# Patient Record
Sex: Male | Born: 1985
Health system: Southern US, Community
[De-identification: ages and names within clinical notes are randomized; demographics above are authoritative.]

## PROBLEM LIST (undated history)

## (undated) DIAGNOSIS — T7840XA Allergy, unspecified, initial encounter: Secondary | ICD-10-CM

## (undated) DIAGNOSIS — J45909 Unspecified asthma, uncomplicated: Secondary | ICD-10-CM

## (undated) HISTORY — DX: Allergy, unspecified, initial encounter: T78.40XA

## (undated) HISTORY — DX: Unspecified asthma, uncomplicated: J45.909

---

## 2014-10-18 ENCOUNTER — Emergency Department: Payer: Self-pay | Admitting: Student

## 2014-10-20 ENCOUNTER — Ambulatory Visit (INDEPENDENT_AMBULATORY_CARE_PROVIDER_SITE_OTHER): Payer: BLUE CROSS/BLUE SHIELD | Admitting: Internal Medicine

## 2014-10-20 ENCOUNTER — Encounter: Payer: Self-pay | Admitting: Internal Medicine

## 2014-10-20 VITALS — BP 146/84 | HR 104 | Temp 98.7°F | Wt 318.0 lb

## 2014-10-20 DIAGNOSIS — E669 Obesity, unspecified: Secondary | ICD-10-CM | POA: Insufficient documentation

## 2014-10-20 DIAGNOSIS — K219 Gastro-esophageal reflux disease without esophagitis: Secondary | ICD-10-CM

## 2014-10-20 DIAGNOSIS — I10 Essential (primary) hypertension: Secondary | ICD-10-CM

## 2014-10-20 NOTE — Progress Notes (Signed)
HPI  Pt presents to the clinic today to establish care and for management of the conditions listed below.  HTN: He reports he was started on HCTZ (January 2016) for his blood pressure. He took it for 2 days then developed a headache, so he stopped the medication. His BP today is 146/84. He denies headache, blurred vision, chest pain or shortness of breath.  Obesity: His weight today is 318 lbs. He reports he has lost 30 lbs in the last 2 months. He has been working on diet and exercise.  He also reports he has been having some fatigue, loss of appetite, abdominal pain, bloating, belching, loose watery stool. He reports this has been consistent for the last 2 weeks. He has had some associated nausea and vomiting. He has been able to correlate his symptoms with drinking something acidic. He went to UC 2 days ago for the same. Blood work was normal. Ultrasound of liver and gallbladder were normal. He was given zofran and omeprazole. He reports significant improvement in his symptoms although the zofran has now made him constipated. He wants to know where he goes from here.  Flu: > 5 years ago Tetanus: > 10 years ago Dentist: as needed  Past Medical History  Diagnosis Date  . Allergy   . Asthma     childhood    Current Outpatient Prescriptions  Medication Sig Dispense Refill  . hydrochlorothiazide (HYDRODIURIL) 25 MG tablet Take 25 mg by mouth daily.    [name] Kitchen. omeprazole (PRILOSEC) 20 MG capsule Take 20 mg by mouth daily.   0  . ondansetron (ZOFRAN-ODT) 4 MG disintegrating tablet Take 4 mg by mouth every 8 (eight) hours as needed.   0   No current facility-administered medications for this visit.    No Known Allergies  Family History  Problem Relation Age of Onset  . Stroke Maternal Grandfather   . Hypertension Paternal Grandfather     History   Social History  . Marital Status: Single    Spouse Name: N/A  . Number of Children: N/A  . Years of Education: N/A   Occupational  History  . Not on file.   Social History Main Topics  . Smoking status: Never Smoker   . Smokeless tobacco: Never Used  . Alcohol Use: No  . Drug Use: No  . Sexual Activity: Not on file   Other Topics Concern  . Not on file   Social History Narrative  . No narrative on file    ROS:  Constitutional: Pt reports fatigue. Denies fever, malaise, headache or abrupt weight changes.  HEENT: Denies eye pain, eye redness, ear pain, ringing in the ears, wax buildup, runny nose, nasal congestion, bloody nose, or sore throat. Respiratory: Denies difficulty breathing, shortness of breath, cough or sputum production.   Cardiovascular: Denies chest pain, chest tightness, palpitations or swelling in the hands or feet.  Gastrointestinal: Pt reports bloating. Denies abdominal pain,constipation, diarrhea or blood in the stool.  Skin: Denies redness, rashes, lesions or ulcercations.  Neurological: Denies dizziness, difficulty with memory, difficulty with speech or problems with balance and coordination.   No other specific complaints in a complete review of systems (except as listed in HPI above).  PE:  BP 146/84 mmHg  Pulse 104  Temp(Src) 98.7 F (37.1 C) (Oral)  Wt 318 lb (144.244 kg)  SpO2 98% Wt Readings from Last 3 Encounters:  10/20/14 318 lb (144.244 kg)    General: Appears his stated age, obese in NAD. HEENT: Head:  normal shape and size; Eyes: sclera white, no icterus, conjunctiva pink, PERRLA and EOMs intact; Neck:  Neck supple, trachea midline. No masses, lumps or thyromegaly present.  Cardiovascular: Normal rate and rhythm. S1,S2 noted.  No murmur, rubs or gallops noted. No JVD or BLE edema. No carotid bruits noted. Pulmonary/Chest: Normal effort and positive vesicular breath sounds. No respiratory distress. No wheezes, rales or ronchi noted.  Abdomen: Soft and nontender. Normal bowel sounds, no bruits noted. No distention or masses noted. Liver, spleen and kidneys non  palpable. Neurological: Alert and oriented.  Psychiatric: Mood and affect normal. Behavior is normal. Judgment and thought content normal.    Assessment and Plan:  RTC in 1 month for BP follow up

## 2014-10-20 NOTE — Assessment & Plan Note (Signed)
New onset but has been severe over the last 2 weeks [facility] He reports labs and ultrasound were normal He is improving on Prilosec Continue for now Since acidic foods make it worse, try to avoid acidic foods

## 2014-10-20 NOTE — Assessment & Plan Note (Signed)
We will restart HCTZ He will take 1/2 tab daily x 1 week, then increase to 2 tabs If he experiences headaches again, he will stop medication and call me [facility] will [facility]

## 2014-10-20 NOTE — Assessment & Plan Note (Signed)
He is working on diet and exercise  Continue for now

## 2014-10-20 NOTE — Patient Instructions (Signed)

## 2014-10-20 NOTE — Addendum Note (Signed)
Addended by: Lorre MunroeBAITY, Mazal Ebey W on: 10/20/2014 03:58 PM   Modules accepted: Level of Service, SmartSet

## 2014-10-21 ENCOUNTER — Telehealth: Payer: Self-pay | Admitting: Internal Medicine

## 2014-10-21 ENCOUNTER — Encounter: Payer: Self-pay | Admitting: Internal Medicine

## 2014-10-21 NOTE — Telephone Encounter (Signed)
emmi emailed °

## 2014-10-27 ENCOUNTER — Encounter: Payer: Self-pay | Admitting: Internal Medicine

## 2014-11-11 ENCOUNTER — Encounter: Payer: Self-pay | Admitting: Internal Medicine

## 2014-11-11 ENCOUNTER — Ambulatory Visit (INDEPENDENT_AMBULATORY_CARE_PROVIDER_SITE_OTHER): Payer: BLUE CROSS/BLUE SHIELD | Admitting: Internal Medicine

## 2014-11-11 VITALS — BP 138/86 | HR 93 | Temp 98.1°F | Wt 310.0 lb

## 2014-11-11 DIAGNOSIS — K219 Gastro-esophageal reflux disease without esophagitis: Secondary | ICD-10-CM | POA: Diagnosis not present

## 2014-11-11 DIAGNOSIS — I10 Essential (primary) hypertension: Secondary | ICD-10-CM

## 2014-11-11 LAB — BASIC METABOLIC PANEL
BUN: 8 mg/dL (ref 6–23)
CHLORIDE: 102 meq/L (ref 96–112)
CO2: 28 mEq/L (ref 19–32)
Calcium: 9.4 mg/dL (ref 8.4–10.5)
Creatinine, Ser: 0.9 mg/dL (ref 0.40–1.50)
GFR: 127.67 mL/min (ref >60.00)
Glucose, Bld: 87 mg/dL (ref 70–99)
Potassium: 3.6 mEq/L (ref 3.5–5.1)
SODIUM: 136 meq/L (ref 135–145)

## 2014-11-11 MED ORDER — HYDROCHLOROTHIAZIDE 25 MG PO TABS: 25.0000 mg | ORAL_TABLET | Freq: Every day | ORAL | Status: DC

## 2014-11-11 NOTE — Progress Notes (Signed)
Subjective:    Patient ID: Chris Banks, male    DOB: 03/23/1986, 29 y.o.   MRN: [id number]  HPI  Pt presents to the clinic today for 1 month follow up HTN. He was restarted on his HCTZ at his last visit. He was on this prior but for a short times because he felt like it was causing him to have headaches. He was instructed to start the medication, and let me know if he was having any adverse effects. He has been taking the medication as directed.He denies any adverse effect. His BP today is 138/86.  He also reports the Omeprazole is helping him with his reflux symptoms. He continues to have decreased appetite in the morning but denies nausea, belching, bloating and loose stools. He is taking the Prilosec as directed. He is concerned about some changes in his stool color. It has been a orange color. He does not think it is blood in his stool but he wants to just to make sure.  Review of Systems      Past Medical History  Diagnosis Date  . Allergy   . Asthma     childhood    Current Outpatient Prescriptions  Medication Sig Dispense Refill  . hydrochlorothiazide (HYDRODIURIL) 25 MG tablet Take 25 mg by mouth daily.    [name] Kitchen. omeprazole (PRILOSEC) 20 MG capsule Take 20 mg by mouth daily.   0  . ondansetron (ZOFRAN-ODT) 4 MG disintegrating tablet Take 4 mg by mouth every 8 (eight) hours as needed.   0   No current facility-administered medications for this visit.    No Known Allergies  Family History  Problem Relation Age of Onset  . Stroke Maternal Grandfather   . Hypertension Paternal Grandfather   . Cancer Neg Hx     History   Social History  . Marital Status: Single    Spouse Name: N/A  . Number of Children: N/A  . Years of Education: N/A   Occupational History  . Not on file.   Social History Main Topics  . Smoking status: Never Smoker   . Smokeless tobacco: Never Used  . Alcohol Use: No  . Drug Use: No  . Sexual Activity: Yes   Other Topics Concern  . Not on file    Social History Narrative     Constitutional: Denies fever, malaise, fatigue, headache or abrupt weight changes.  Respiratory: Denies difficulty breathing, shortness of breath, cough or sputum production.   Cardiovascular: Denies chest pain, chest tightness, palpitations or swelling in the hands or feet.  Gastrointestinal: Denies abdominal pain, bloating, constipation, diarrhea or blood in the stool.   Neurological: Denies dizziness, difficulty with memory, difficulty with speech or problems with balance and coordination.   No other specific complaints in a complete review of systems (except as listed in HPI above).  Objective:   Physical Exam   BP 138/86 mmHg  Pulse 93  Temp(Src) 98.1 F (36.7 C) (Oral)  Wt 310 lb (140.615 kg)  SpO2 98% Wt Readings from Last 3 Encounters:  11/11/14 310 lb (140.615 kg)  10/20/14 318 lb (144.244 kg)    General: Appears his stated age, obese in NAD. Skin: Warm, dry and intact. No rashes, lesions or ulcerations noted. HEENT: Head: normal shape and size; Eyes: sclera white, no icterus, conjunctiva pink, PERRLA and EOMs intact; Cardiovascular: Normal rate and rhythm. S1,S2 noted.  No murmur, rubs or gallops noted.  Pulmonary/Chest: Normal effort and positive vesicular breath sounds. No respiratory distress. No  wheezes, rales or ronchi noted.  Abdomen: Soft and nontender. Normal bowel sounds, no bruits noted. No distention or masses noted. Liver, spleen and kidneys non palpable. Neurological: Alert and oriented.  Rectal: No external hemorrhoid noted. Normal rectal tone, no internal mass noted.     Assessment & Plan:

## 2014-11-11 NOTE — Assessment & Plan Note (Signed)
Well controlled on HCTZ Will check BMET today Continue to work on diet and exercise

## 2014-11-11 NOTE — Patient Instructions (Signed)

## 2014-11-11 NOTE — Progress Notes (Signed)
Pre visit review using our clinic review tool, if applicable. No additional management support is needed unless otherwise documented below in the visit note. 

## 2014-11-11 NOTE — Assessment & Plan Note (Signed)
Symptoms improved on Prilosec He is concerned about blood in his stool, hemoccult negative Will check for Hpylori today

## 2014-11-11 NOTE — Addendum Note (Signed)
Addended by: Baldomero LamyHAVERS, Marigold Mom C on: 11/11/2014 04:23 PM   Modules accepted: Kipp BroodSmartSet

## 2014-11-12 ENCOUNTER — Encounter: Payer: Self-pay | Admitting: Internal Medicine

## 2014-11-13 ENCOUNTER — Encounter: Payer: Self-pay | Admitting: Internal Medicine

## 2014-11-13 LAB — H. PYLORI ANTIBODY, IGG: H Pylori IgG: NEGATIVE

## 2014-11-18 ENCOUNTER — Ambulatory Visit: Payer: BLUE CROSS/BLUE SHIELD | Admitting: Internal Medicine

## 2014-11-30 ENCOUNTER — Encounter: Payer: Self-pay | Admitting: Internal Medicine

## 2014-12-01 ENCOUNTER — Other Ambulatory Visit: Payer: Self-pay | Admitting: Internal Medicine

## 2014-12-01 ENCOUNTER — Encounter: Payer: Self-pay | Admitting: Internal Medicine

## 2014-12-01 MED ORDER — OMEPRAZOLE 20 MG PO CPDR: 20.0000 mg | DELAYED_RELEASE_CAPSULE | Freq: Every day | ORAL | Status: DC

## 2014-12-05 ENCOUNTER — Encounter: Payer: Self-pay | Admitting: Internal Medicine

## 2014-12-16 ENCOUNTER — Encounter: Payer: Self-pay | Admitting: Internal Medicine

## 2014-12-17 ENCOUNTER — Other Ambulatory Visit: Payer: Self-pay | Admitting: Internal Medicine

## 2014-12-17 MED ORDER — OMEPRAZOLE 20 MG PO CPDR: 20.0000 mg | DELAYED_RELEASE_CAPSULE | Freq: Every day | ORAL | Status: DC

## 2015-01-15 ENCOUNTER — Encounter: Payer: Self-pay | Admitting: Internal Medicine

## 2015-01-15 MED ORDER — OMEPRAZOLE 20 MG PO CPDR: 20.0000 mg | DELAYED_RELEASE_CAPSULE | Freq: Every day | ORAL | Status: DC

## 2015-02-09 ENCOUNTER — Encounter: Payer: Self-pay | Admitting: Internal Medicine

## 2015-02-09 DIAGNOSIS — I10 Essential (primary) hypertension: Secondary | ICD-10-CM

## 2015-02-10 MED ORDER — HYDROCHLOROTHIAZIDE 25 MG PO TABS
25.0000 mg | ORAL_TABLET | Freq: Every day | ORAL | Status: DC
Start: 2015-02-10 — End: 2015-04-15

## 2015-02-25 ENCOUNTER — Encounter: Payer: Self-pay | Admitting: Internal Medicine

## 2015-02-25 NOTE — Telephone Encounter (Signed)
I did ask pt if he was having any other Sx and what he has tried to cut down on the back and forth msg---review email response and please advise

## 2015-04-14 ENCOUNTER — Encounter: Payer: Self-pay | Admitting: Internal Medicine

## 2015-04-14 DIAGNOSIS — I10 Essential (primary) hypertension: Secondary | ICD-10-CM

## 2015-04-15 MED ORDER — HYDROCHLOROTHIAZIDE 25 MG PO TABS: 25.0000 mg | ORAL_TABLET | Freq: Every day | ORAL | Status: DC

## 2015-04-15 MED ORDER — OMEPRAZOLE 20 MG PO CPDR: 20.0000 mg | DELAYED_RELEASE_CAPSULE | Freq: Every day | ORAL | Status: DC

## 2015-05-13 ENCOUNTER — Ambulatory Visit (INDEPENDENT_AMBULATORY_CARE_PROVIDER_SITE_OTHER): Payer: BLUE CROSS/BLUE SHIELD | Admitting: Internal Medicine

## 2015-05-13 ENCOUNTER — Encounter: Payer: Self-pay | Admitting: Internal Medicine

## 2015-05-13 VITALS — BP 134/80 | HR 100 | Temp 98.7°F | Wt 290.0 lb

## 2015-05-13 DIAGNOSIS — E669 Obesity, unspecified: Secondary | ICD-10-CM | POA: Diagnosis not present

## 2015-05-13 DIAGNOSIS — Z23 Encounter for immunization: Secondary | ICD-10-CM | POA: Diagnosis not present

## 2015-05-13 DIAGNOSIS — K219 Gastro-esophageal reflux disease without esophagitis: Secondary | ICD-10-CM | POA: Diagnosis not present

## 2015-05-13 DIAGNOSIS — I10 Essential (primary) hypertension: Secondary | ICD-10-CM | POA: Diagnosis not present

## 2015-05-13 LAB — COMPREHENSIVE METABOLIC PANEL
ALT: 21 U/L (ref 0–53)
AST: 20 U/L (ref 0–37)
Albumin: 4.2 g/dL (ref 3.5–5.2)
Alkaline Phosphatase: 49 U/L (ref 39–117)
BUN: 10 mg/dL (ref 6–23)
CHLORIDE: 102 meq/L (ref 96–112)
CO2: 27 mEq/L (ref 19–32)
Calcium: 9.5 mg/dL (ref 8.4–10.5)
Creatinine, Ser: 0.89 mg/dL (ref 0.40–1.50)
GFR: 128.89 mL/min (ref >60.00)
GLUCOSE: 92 mg/dL (ref 70–99)
POTASSIUM: 3.7 meq/L (ref 3.5–5.1)
SODIUM: 137 meq/L (ref 135–145)
Total Bilirubin: 0.5 mg/dL (ref 0.2–1.2)
Total Protein: 7.5 g/dL (ref 6.0–8.3)

## 2015-05-13 MED ORDER — OMEPRAZOLE 20 MG PO CPDR: 20.0000 mg | DELAYED_RELEASE_CAPSULE | Freq: Every day | ORAL | Status: DC

## 2015-05-13 MED ORDER — HYDROCHLOROTHIAZIDE 25 MG PO TABS: 25.0000 mg | ORAL_TABLET | Freq: Every day | ORAL | Status: DC

## 2015-05-13 NOTE — Addendum Note (Signed)
Addended by: Roena Malady on: 05/13/2015 02:16 PM   Modules accepted: Orders

## 2015-05-13 NOTE — Patient Instructions (Signed)
Hypertension Hypertension, commonly called high blood pressure, is when the force of blood pumping through your arteries is too strong. Your arteries are the blood vessels that carry blood from your heart throughout your body. A blood pressure reading consists of a higher number over a lower number, such as 110/72. The higher number (systolic) is the pressure inside your arteries when your heart pumps. The lower number (diastolic) is the pressure inside your arteries when your heart relaxes. Ideally you want your blood pressure below 120/80. Hypertension forces your heart to work harder to pump blood. Your arteries may become narrow or stiff. Having untreated or uncontrolled hypertension can cause heart attack, stroke, kidney disease, and other problems. RISK FACTORS Some risk factors for high blood pressure are controllable. Others are not.  Risk factors you cannot control include:   Race. You may be at higher risk if you are African American.  Age. Risk increases with age.  Gender. Men are at higher risk than women before age 45 years. After age 65, women are at higher risk than men. Risk factors you can control include:  Not getting enough exercise or physical activity.  Being overweight.  Getting too much fat, sugar, calories, or salt in your diet.  Drinking too much alcohol. SIGNS AND SYMPTOMS Hypertension does not usually cause signs or symptoms. Extremely high blood pressure (hypertensive crisis) may cause headache, anxiety, shortness of breath, and nosebleed. DIAGNOSIS To check if you have hypertension, your health care provider will measure your blood pressure while you are seated, with your arm held at the level of your heart. It should be measured at least twice using the same arm. Certain conditions can cause a difference in blood pressure between your right and left arms. A blood pressure reading that is higher than normal on one occasion does not mean that you need treatment. If  it is not clear whether you have high blood pressure, you may be asked to return on a different day to have your blood pressure checked again. Or, you may be asked to monitor your blood pressure at home for 1 or more weeks. TREATMENT Treating high blood pressure includes making lifestyle changes and possibly taking medicine. Living a healthy lifestyle can help lower high blood pressure. You may need to change some of your habits. Lifestyle changes may include:  Following the DASH diet. This diet is high in fruits, vegetables, and whole grains. It is low in salt, red meat, and added sugars.  Keep your sodium intake below 2,300 mg per day.  Getting at least 30-45 minutes of aerobic exercise at least 4 times per week.  Losing weight if necessary.  Not smoking.  Limiting alcoholic beverages.  Learning ways to reduce stress. Your health care provider may prescribe medicine if lifestyle changes are not enough to get your blood pressure under control, and if one of the following is true:  You are 18-59 years of age and your systolic blood pressure is above 140.  You are 60 years of age or older, and your systolic blood pressure is above 150.  Your diastolic blood pressure is above 90.  You have diabetes, and your systolic blood pressure is over 140 or your diastolic blood pressure is over 90.  You have kidney disease and your blood pressure is above 140/90.  You have heart disease and your blood pressure is above 140/90. Your personal target blood pressure may vary depending on your medical conditions, your age, and other factors. HOME CARE INSTRUCTIONS    Have your blood pressure rechecked as directed by your health care provider.   Take medicines only as directed by your health care provider. Follow the directions carefully. Blood pressure medicines must be taken as prescribed. The medicine does not work as well when you skip doses. Skipping doses also puts you at risk for  problems.  Do not smoke.   Monitor your blood pressure at home as directed by your health care provider. SEEK MEDICAL CARE IF:   You think you are having a reaction to medicines taken.  You have recurrent headaches or feel dizzy.  You have swelling in your ankles.  You have trouble with your vision. SEEK IMMEDIATE MEDICAL CARE IF:  You develop a severe headache or confusion.  You have unusual weakness, numbness, or feel faint.  You have severe chest or abdominal pain.  You vomit repeatedly.  You have trouble breathing. MAKE SURE YOU:   Understand these instructions.  Will watch your condition.  Will get help right away if you are not doing well or get worse.   This information is not intended to replace advice given to you by your health care provider. Make sure you discuss any questions you have with your health care provider.   Document Released: 07/25/2005 Document Revised: 12/09/2014 Document Reviewed: 05/17/2013 Elsevier Interactive Patient Education 2016 Elsevier Inc.  

## 2015-05-13 NOTE — Assessment & Plan Note (Addendum)
BP recheck 134/80 Will check CMET today Continue HCTZ, refilled today per request

## 2015-05-13 NOTE — Assessment & Plan Note (Signed)
No issues on Prilosec, refilled per request Discussed how weight loss will help improve his symtoms Will check CMET today

## 2015-05-13 NOTE — Progress Notes (Signed)
Pre visit review using our clinic review tool, if applicable. No additional management support is needed unless otherwise documented below in the visit note. 

## 2015-05-13 NOTE — Progress Notes (Signed)
Subjective:    Patient ID: Chris Banks, male    DOB: [birth year], 29 y.o.   MRN: [id number]  HPI  Pt presents to the clinic today for 6 month follow up of chronic conditions.  HTN: He takes the HCTZ daily as prescribed. His BP today is 142/78. ECG from 10/2014 reviewed.  GERD: No breakthrough symptoms on Omeprazole.  Obesity: He has lost 20 lbs since his last visit. He has started running. He has also cut back on his carb intake.  He does want his flu shot today.  Review of Systems      Past Medical History  Diagnosis Date  . Allergy   . Asthma     childhood    Current Outpatient Prescriptions  Medication Sig Dispense Refill  . hydrochlorothiazide (HYDRODIURIL) 25 MG tablet Take 1 tablet (25 mg total) by mouth daily. 30 tablet 2  . omeprazole (PRILOSEC) 20 MG capsule Take 1 capsule (20 mg total) by mouth daily. 30 capsule 5  . ondansetron (ZOFRAN-ODT) 4 MG disintegrating tablet Take 4 mg by mouth every 8 (eight) hours as needed.   0   No current facility-administered medications for this visit.    No Known Allergies  Family History  Problem Relation Age of Onset  . Stroke Maternal Grandfather   . Hypertension Paternal Grandfather   . Cancer Neg Hx     Social History   Social History  . Marital Status: Single    Spouse Name: N/A  . Number of Children: N/A  . Years of Education: N/A   Occupational History  . Not on file.   Social History Main Topics  . Smoking status: Never Smoker   . Smokeless tobacco: Never Used  . Alcohol Use: No  . Drug Use: No  . Sexual Activity: Yes   Other Topics Concern  . Not on file   Social History Narrative     Constitutional: Denies fever, malaise, fatigue, headache or abrupt weight changes.  Respiratory: Denies difficulty breathing, shortness of breath, cough or sputum production.   Cardiovascular: Denies chest pain, chest tightness, palpitations or swelling in the hands or feet.  Gastrointestinal: Denies abdominal  pain, bloating, constipation, diarrhea or blood in the stool.  Neurological: Denies dizziness, difficulty with memory, difficulty with speech or problems with balance and coordination.   No other specific complaints in a complete review of systems (except as listed in HPI above).  Objective:   Physical Exam  BP 142/78 mmHg  Pulse 100  Temp(Src) 98.7 F (37.1 C) (Oral)  Wt 290 lb (131.543 kg)  SpO2 98% Wt Readings from Last 3 Encounters:  05/13/15 290 lb (131.543 kg)  11/11/14 310 lb (140.615 kg)  10/20/14 318 lb (144.244 kg)    General: Appears his stated age, obese in NAD. Cardiovascular: Normal rate and rhythm. S1,S2 noted.  No murmur, rubs or gallops noted.  Pulmonary/Chest: Normal effort and positive vesicular breath sounds. No respiratory distress. No wheezes, rales or ronchi noted.  Abdomen: Soft and nontender. Normal bowel sounds, no bruits noted. No distention or masses noted. Liver, spleen and kidneys non palpable. Neurological: Alert and oriented.   BMET    Component Value Date/Time   NA 136 11/11/2014 1358   K 3.6 11/11/2014 1358   CL 102 11/11/2014 1358   CO2 28 11/11/2014 1358   GLUCOSE 87 11/11/2014 1358   BUN 8 11/11/2014 1358   CREATININE 0.90 11/11/2014 1358   CALCIUM 9.4 11/11/2014 1358  Assessment & Plan:

## 2015-05-13 NOTE — Assessment & Plan Note (Signed)
Congratulated him on the 28 lb weight loss Encouraged him to continue his diet and exercise regimen

## 2015-05-13 NOTE — Addendum Note (Signed)
Addended by: Lorre Munroe on: 05/13/2015 02:20 PM   Modules accepted: Kipp Brood

## 2015-08-10 ENCOUNTER — Encounter: Payer: Self-pay | Admitting: Internal Medicine

## 2015-09-07 ENCOUNTER — Encounter: Payer: Self-pay | Admitting: Internal Medicine

## 2015-09-14 ENCOUNTER — Encounter: Payer: Self-pay | Admitting: Internal Medicine

## 2015-09-14 ENCOUNTER — Ambulatory Visit (INDEPENDENT_AMBULATORY_CARE_PROVIDER_SITE_OTHER): Payer: BLUE CROSS/BLUE SHIELD | Admitting: Internal Medicine

## 2015-09-14 VITALS — BP 140/84 | HR 103 | Temp 98.1°F | Wt 294.0 lb

## 2015-09-14 DIAGNOSIS — K219 Gastro-esophageal reflux disease without esophagitis: Secondary | ICD-10-CM | POA: Diagnosis not present

## 2015-09-14 NOTE — Patient Instructions (Signed)
Probiotics  WHAT ARE PROBIOTICS?  Probiotics are the good bacteria and yeasts that live in your body and keep you and your digestive system healthy. Probiotics also help your body's defense (immune) system and protect your body against bad bacterial growth.   Certain foods contain probiotics, such as yogurt. Probiotics can also be purchased as a supplement. As with any supplement or drug, it is important to discuss its use with your health care provider.   WHAT AFFECTS THE BALANCE OF BACTERIA IN MY BODY?  The balance of bacteria in your body can be affected by:   · Antibiotic medicines. Antibiotics are sometimes necessary to treat infection. Unfortunately, they may kill good or friendly bacteria in your body as well as the bad bacteria. This may lead to stomach problems like diarrhea, gas, and cramping.  · Disease. Some conditions are the result of an overgrowth of bad bacteria, yeasts, parasites, or fungi. These conditions include:      Infectious diarrhea.    Stomach and respiratory infections.    Skin infections.    Irritable bowel syndrome (IBS).    Inflammatory bowel diseases.    Ulcer due to Helicobacter pylori (H. pylori) infection.    Tooth decay and periodontal disease.    Vaginal infections.  Stress and poor diet may also lower the good bacteria in your body.   WHAT TYPE OF PROBIOTIC IS RIGHT FOR ME?  Probiotics are available over the counter at your local pharmacy, health food, or grocery store. They come in many different forms, combinations of strains, and dosing strengths. Some may need to be refrigerated. Always read the label for storage and usage instructions.  Specific strains have been shown to be more effective for certain conditions. Ask your health care provider what option is best for you.   WHY WOULD I NEED PROBIOTICS?  There are many reasons your health care provider might recommend a probiotic supplement, including:   · Diarrhea.  · Constipation.  · IBS.  · Respiratory infections.  · Yeast  infections.  · Acne, eczema, and other skin conditions.  · Frequent urinary tract infections (UTIs).  ARE THERE SIDE EFFECTS OF PROBIOTICS?  Some people experience mild side effects when taking probiotics. Side effects are usually temporary and may include:   · Gas.  · Bloating.  · Cramping.  Rarely, serious side effects, such as infection or immune system changes, may occur.  WHAT ELSE DO I NEED TO KNOW ABOUT PROBIOTICS?   · There are many different strains of probiotics. Certain strains may be more effective depending on your condition. Probiotics are available in varying doses. Ask your health care provider which probiotic you should use and how often.    · If you are taking probiotics along with antibiotics, it is generally recommended to wait at least 2 hours between taking the antibiotic and taking the probiotic.    FOR MORE INFORMATION:   National Center for Complementary and Alternative Medicine http://nccam.nih.gov/     This information is not intended to replace advice given to you by your health care provider. Make sure you discuss any questions you have with your health care provider.     Document Released: 02/19/2014 Document Reviewed: 02/19/2014  Elsevier Interactive Patient Education ©2016 Elsevier Inc.

## 2015-09-14 NOTE — Progress Notes (Signed)
Pre visit review using our clinic review tool, if applicable. No additional management support is needed unless otherwise documented below in the visit note. 

## 2015-09-14 NOTE — Progress Notes (Signed)
Subjective:    Patient ID: Chris Banks, male    DOB: 1985/08/17, 30 y.o.   MRN: [id number]  HPI  Pt presents to the clinic today with c/o increased burping and burning sensation in the chest. The burning sensation is in the epigastric area, and radiates around to left pec. He notes it is worse after eating and at night. He was traveling this past week and the pain seemed to be worse as he was not eating his normal diet or schedule. He usually takes Omeprazole 20 mg daily. He has been taking Omeprazole BID in the morning and at night for about a week and has noticed some improvement. He reports his bowels are moving normally. He denies chest pain or shortness of breath.     Review of Systems  Past Medical History  Diagnosis Date  . Allergy   . Asthma     childhood    Current Outpatient Prescriptions  Medication Sig Dispense Refill  . hydrochlorothiazide (HYDRODIURIL) 25 MG tablet Take 1 tablet (25 mg total) by mouth daily. 90 tablet 1  . omeprazole (PRILOSEC) 20 MG capsule Take 1 capsule (20 mg total) by mouth daily. 90 capsule 1  . ondansetron (ZOFRAN-ODT) 4 MG disintegrating tablet Take 4 mg by mouth every 8 (eight) hours as needed. Reported on 09/14/2015  0   No current facility-administered medications for this visit.    No Known Allergies  Family History  Problem Relation Age of Onset  . Stroke Maternal Grandfather   . Hypertension Paternal Grandfather   . Cancer Neg Hx     Social History   Social History  . Marital Status: Single    Spouse Name: N/A  . Number of Children: N/A  . Years of Education: N/A   Occupational History  . Not on file.   Social History Main Topics  . Smoking status: Never Smoker   . Smokeless tobacco: Never Used  . Alcohol Use: 0.0 oz/week    0 Standard drinks or equivalent per week     Comment: rare  . Drug Use: No  . Sexual Activity: Yes   Other Topics Concern  . Not on file   Social History Narrative      Respiratory:  Denies difficulty breathing or shortness of breath. Cardiovascular: Denies palpitations or swelling in the hands or feet.  Gastrointestinal: Positive burning in epigastric area radiating to the left pec and increased burping. Denies constipation, diarrhea or blood in the stool.   No other specific complaints in a complete review of systems (except as listed in HPI above).     Objective:   Physical Exam  BP 140/84 mmHg  Pulse 103  Temp(Src) 98.1 F (36.7 C) (Oral)  Wt 294 lb (133.358 kg)  SpO2 98% Wt Readings from Last 3 Encounters:  09/14/15 294 lb (133.358 kg)  05/13/15 290 lb (131.543 kg)  11/11/14 310 lb (140.615 kg)    General: Appears his stated age, in NAD. Skin: Warm, dry and intact. No rashes, lesions or ulcerations noted. Cardiovascular: Normal rate and rhythm. S1,S2 noted.  No murmur, rubs or gallops noted.  Pulmonary/Chest: Normal effort and positive vesicular breath sounds. No respiratory distress. No wheezes, rales or ronchi noted.  Abdomen: Soft and nontender. Normal bowel sounds. No distention or masses noted. Liver, spleen and kidneys non palpable.    BMET    Component Value Date/Time   NA 137 05/13/2015 1417   K 3.7 05/13/2015 1417   CL 102 05/13/2015 1417  CO2 27 05/13/2015 1417   GLUCOSE 92 05/13/2015 1417   BUN 10 05/13/2015 1417   CREATININE 0.89 05/13/2015 1417   CALCIUM 9.5 05/13/2015 1417    Lipid Panel  No results found for: CHOL, TRIG, HDL, CHOLHDL, VLDL, LDLCALC  CBC No results found for: WBC, RBC, HGB, HCT, PLT, MCV, MCH, MCHC, RDW, LYMPHSABS, MONOABS, EOSABS, BASOSABS  Hgb A1C No results found for: HGBA1C       Assessment & Plan:  GERD:  Continue Prilosec BID until his appointment in April Get back on your normal diet regimen Consider starting on a Probiotic OTC If persist, consider referral to GI   RTC if symptoms worsen or as needed.

## 2015-10-08 ENCOUNTER — Ambulatory Visit (INDEPENDENT_AMBULATORY_CARE_PROVIDER_SITE_OTHER): Payer: BLUE CROSS/BLUE SHIELD | Admitting: Internal Medicine

## 2015-10-08 ENCOUNTER — Encounter: Payer: Self-pay | Admitting: Internal Medicine

## 2015-10-08 DIAGNOSIS — S0990XA Unspecified injury of head, initial encounter: Secondary | ICD-10-CM

## 2015-10-08 NOTE — Patient Instructions (Signed)
Concussion, Adult  A concussion, or closed-head injury, is a brain injury caused by a direct blow to the head or by a quick and sudden movement (jolt) of the head or neck. Concussions are usually not life-threatening. Even so, the effects of a concussion can be serious. If you have had a concussion before, you are more likely to experience concussion-like symptoms after a direct blow to the head.   CAUSES  · Direct blow to the head, such as from running into another player during a soccer game, being hit in a fight, or hitting your head on a hard surface.  · A jolt of the head or neck that causes the brain to move back and forth inside the skull, such as in a car crash.  SIGNS AND SYMPTOMS  The signs of a concussion can be hard to notice. Early on, they may be missed by you, family members, and health care providers. You may look fine but act or feel differently.  Symptoms are usually temporary, but they may last for days, weeks, or even longer. Some symptoms may appear right away while others may not show up for hours or days. Every head injury is different. Symptoms include:  · Mild to moderate headaches that will not go away.  · A feeling of pressure inside your head.  · Having more trouble than usual:    Learning or remembering things you have heard.    Answering questions.    Paying attention or concentrating.    Organizing daily tasks.    Making decisions and solving problems.  · Slowness in thinking, acting or reacting, speaking, or reading.  · Getting lost or being easily confused.  · Feeling tired all the time or lacking energy (fatigued).  · Feeling drowsy.  · Sleep disturbances.    Sleeping more than usual.    Sleeping less than usual.    Trouble falling asleep.    Trouble sleeping (insomnia).  · Loss of balance or feeling lightheaded or dizzy.  · Nausea or vomiting.  · Numbness or tingling.  · Increased sensitivity to:    Sounds.    Lights.    Distractions.  · Vision problems or eyes that tire  easily.  · Diminished sense of taste or smell.  · Ringing in the ears.  · Mood changes such as feeling sad or anxious.  · Becoming easily irritated or angry for little or no reason.  · Lack of motivation.  · Seeing or hearing things other people do not see or hear (hallucinations).  DIAGNOSIS  Your health care provider can usually diagnose a concussion based on a description of your injury and symptoms. He or she will ask whether you passed out (lost consciousness) and whether you are having trouble remembering events that happened right before and during your injury.  Your evaluation might include:  · A brain scan to look for signs of injury to the brain. Even if the test shows no injury, you may still have a concussion.  · Blood tests to be sure other problems are not present.  TREATMENT  · Concussions are usually treated in an emergency department, in urgent care, or at a clinic. You may need to stay in the hospital overnight for further treatment.  · Tell your health care provider if you are taking any medicines, including prescription medicines, over-the-counter medicines, and natural remedies. Some medicines, such as blood thinners (anticoagulants) and aspirin, may increase the chance of complications. Also tell your health care   provider whether you have had alcohol or are taking illegal drugs. This information may affect treatment.  · Your health care provider will send you home with important instructions to follow.  · How fast you will recover from a concussion depends on many factors. These factors include how severe your concussion is, what part of your brain was injured, your age, and how healthy you were before the concussion.  · Most people with mild injuries recover fully. Recovery can take time. In general, recovery is slower in older persons. Also, persons who have had a concussion in the past or have other medical problems may find that it takes longer to recover from their current injury.  HOME  CARE INSTRUCTIONS  General Instructions  · Carefully follow the directions your health care provider gave you.  · Only take over-the-counter or prescription medicines for pain, discomfort, or fever as directed by your health care provider.  · Take only those medicines that your health care provider has approved.  · Do not drink alcohol until your health care provider says you are well enough to do so. Alcohol and certain other drugs may slow your recovery and can put you at risk of further injury.  · If it is harder than usual to remember things, write them down.  · If you are easily distracted, try to do one thing at a time. For example, do not try to watch TV while fixing dinner.  · Talk with family members or close friends when making important decisions.  · Keep all follow-up appointments. Repeated evaluation of your symptoms is recommended for your recovery.  · Watch your symptoms and tell others to do the same. Complications sometimes occur after a concussion. Older adults with a brain injury may have a higher risk of serious complications, such as a blood clot on the brain.  · Tell your teachers, school nurse, school counselor, coach, athletic trainer, or work manager about your injury, symptoms, and restrictions. Tell them about what you can or cannot do. They should watch for:    Increased problems with attention or concentration.    Increased difficulty remembering or learning new information.    Increased time needed to complete tasks or assignments.    Increased irritability or decreased ability to cope with stress.    Increased symptoms.  · Rest. Rest helps the brain to heal. Make sure you:    Get plenty of sleep at night. Avoid staying up late at night.    Keep the same bedtime hours on weekends and weekdays.    Rest during the day. Take daytime naps or rest breaks when you feel tired.  · Limit activities that require a lot of thought or concentration. These include:    Doing homework or job-related  work.    Watching TV.    Working on the computer.  · Avoid any situation where there is potential for another head injury (football, hockey, soccer, basketball, martial arts, downhill snow sports and horseback riding). Your condition will get worse every time you experience a concussion. You should avoid these activities until you are evaluated by the appropriate follow-up health care providers.  Returning To Your Regular Activities  You will need to return to your normal activities slowly, not all at once. You must give your body and brain enough time for recovery.  · Do not return to sports or other athletic activities until your health care provider tells you it is safe to do so.  · Ask   your health care provider when you can drive, ride a bicycle, or operate heavy machinery. Your ability to react may be slower after a brain injury. Never do these activities if you are dizzy.  · Ask your health care provider about when you can return to work or school.  Preventing Another Concussion  It is very important to avoid another brain injury, especially before you have recovered. In rare cases, another injury can lead to permanent brain damage, brain swelling, or death. The risk of this is greatest during the first 7-10 days after a head injury. Avoid injuries by:  · Wearing a seat belt when riding in a car.  · Drinking alcohol only in moderation.  · Wearing a helmet when biking, skiing, skateboarding, skating, or doing similar activities.  · Avoiding activities that could lead to a second concussion, such as contact or recreational sports, until your health care provider says it is okay.  · Taking safety measures in your home.    Remove clutter and tripping hazards from floors and stairways.    Use grab bars in bathrooms and handrails by stairs.    Place non-slip mats on floors and in bathtubs.    Improve lighting in dim areas.  SEEK MEDICAL CARE IF:  · You have increased problems paying attention or  concentrating.  · You have increased difficulty remembering or learning new information.  · You need more time to complete tasks or assignments than before.  · You have increased irritability or decreased ability to cope with stress.  · You have more symptoms than before.  Seek medical care if you have any of the following symptoms for more than 2 weeks after your injury:  · Lasting (chronic) headaches.  · Dizziness or balance problems.  · Nausea.  · Vision problems.  · Increased sensitivity to noise or light.  · Depression or mood swings.  · Anxiety or irritability.  · Memory problems.  · Difficulty concentrating or paying attention.  · Sleep problems.  · Feeling tired all the time.  SEEK IMMEDIATE MEDICAL CARE IF:  · You have severe or worsening headaches. These may be a sign of a blood clot in the brain.  · You have weakness (even if only in one hand, leg, or part of the face).  · You have numbness.  · You have decreased coordination.  · You vomit repeatedly.  · You have increased sleepiness.  · One pupil is larger than the other.  · You have convulsions.  · You have slurred speech.  · You have increased confusion. This may be a sign of a blood clot in the brain.  · You have increased restlessness, agitation, or irritability.  · You are unable to recognize people or places.  · You have neck pain.  · It is difficult to wake you up.  · You have unusual behavior changes.  · You lose consciousness.  MAKE SURE YOU:  · Understand these instructions.  · Will watch your condition.  · Will get help right away if you are not doing well or get worse.     This information is not intended to replace advice given to you by your health care provider. Make sure you discuss any questions you have with your health care provider.     Document Released: 10/15/2003 Document Revised: 08/15/2014 Document Reviewed: 02/14/2013  [employer]vier Interactive Patient Education ©2016 [employer]
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
## 2015-10-08 NOTE — Progress Notes (Signed)
Pre visit review using our clinic review tool, if applicable. No additional management support is needed unless otherwise documented below in the visit note. 

## 2015-10-09 ENCOUNTER — Ambulatory Visit: Payer: Self-pay | Admitting: Internal Medicine

## 2015-10-12 ENCOUNTER — Encounter: Payer: Self-pay | Admitting: Internal Medicine

## 2015-10-12 DIAGNOSIS — I10 Essential (primary) hypertension: Secondary | ICD-10-CM

## 2015-10-12 MED ORDER — HYDROCHLOROTHIAZIDE 25 MG PO TABS: 25.0000 mg | ORAL_TABLET | Freq: Every day | ORAL | Status: DC

## 2015-11-23 ENCOUNTER — Encounter: Payer: Self-pay | Admitting: Internal Medicine

## 2015-12-02 ENCOUNTER — Encounter: Payer: Self-pay | Admitting: Internal Medicine

## 2015-12-02 ENCOUNTER — Ambulatory Visit (INDEPENDENT_AMBULATORY_CARE_PROVIDER_SITE_OTHER): Payer: BLUE CROSS/BLUE SHIELD | Admitting: Internal Medicine

## 2015-12-02 VITALS — BP 126/82 | HR 82 | Temp 98.2°F | Ht 71.5 in | Wt 297.0 lb

## 2015-12-02 DIAGNOSIS — Z0000 Encounter for general adult medical examination without abnormal findings: Secondary | ICD-10-CM | POA: Diagnosis not present

## 2015-12-02 DIAGNOSIS — Z0001 Encounter for general adult medical examination with abnormal findings: Secondary | ICD-10-CM

## 2015-12-02 DIAGNOSIS — I10 Essential (primary) hypertension: Secondary | ICD-10-CM

## 2015-12-02 DIAGNOSIS — H6123 Impacted cerumen, bilateral: Secondary | ICD-10-CM

## 2015-12-02 DIAGNOSIS — K219 Gastro-esophageal reflux disease without esophagitis: Secondary | ICD-10-CM

## 2015-12-02 NOTE — Progress Notes (Signed)
Subjective:    Patient ID: Chris Banks, male    DOB: [birth year], 30 y.o.   MRN: [id number]  HPI  Pt presents to the clinic today for his annual exam.  HTN: Well controlled on HCTZ.  GERD: No issues on Prilosec.  Diet: He does eat meat. He consumes fruits and veggies daily. He does consume some fried food. He drinks mostly water. Exercise: He is not exercsing right now.  Review of Systems      Past Medical History  Diagnosis Date  . Allergy   . Asthma     childhood    Current Outpatient Prescriptions  Medication Sig Dispense Refill  . hydrochlorothiazide (HYDRODIURIL) 25 MG tablet Take 1 tablet (25 mg total) by mouth daily. 90 tablet 0  . omeprazole (PRILOSEC) 20 MG capsule Take 1 capsule (20 mg total) by mouth daily. (Patient taking differently: Take 20 mg by mouth as needed. ) 90 capsule 1   No current facility-administered medications for this visit.    No Known Allergies  Family History  Problem Relation Age of Onset  . Stroke Maternal Grandfather   . Hypertension Paternal Grandfather   . Cancer Neg Hx     Social History   Social History  . Marital Status: Single    Spouse Name: N/A  . Number of Children: N/A  . Years of Education: N/A   Occupational History  . Not on file.   Social History Main Topics  . Smoking status: Never Smoker   . Smokeless tobacco: Never Used  . Alcohol Use: 0.0 oz/week    0 Standard drinks or equivalent per week     Comment: occasional  . Drug Use: No  . Sexual Activity: Yes   Other Topics Concern  . Not on file   Social History Narrative     Constitutional: Denies fever, malaise, fatigue, headache or abrupt weight changes.  HEENT: Denies eye pain, eye redness, ear pain, ringing in the ears, wax buildup, runny nose, nasal congestion, bloody nose, or sore throat. Respiratory: Denies difficulty breathing, shortness of breath, cough or sputum production.   Cardiovascular: Denies chest pain, chest tightness,  palpitations or swelling in the hands or feet.  Gastrointestinal: Denies abdominal pain, bloating, constipation, diarrhea or blood in the stool.  GU: Denies urgency, frequency, pain with urination, burning sensation, blood in urine, odor or discharge. Musculoskeletal: Denies decrease in range of motion, difficulty with gait, muscle pain or joint pain and swelling.  Skin: Denies redness, rashes, lesions or ulcercations.  Neurological: Denies dizziness, difficulty with memory, difficulty with speech or problems with balance and coordination.  Psych: Denies anxiety, depression, SI/HI.  No other specific complaints in a complete review of systems (except as listed in HPI above).  Objective:   Physical Exam  BP 126/82 mmHg  Pulse 82  Temp(Src) 98.2 F (36.8 C) (Oral)  Ht 5' 11.5" (1.816 m)  Wt 297 lb (134.718 kg)  BMI 40.85 kg/m2  SpO2 98% Wt Readings from Last 3 Encounters:  12/02/15 297 lb (134.718 kg)  10/08/15 294 lb (133.358 kg)  09/14/15 294 lb (133.358 kg)    General: Appears his stated age, obese in NAD. Skin: Warm, dry and intact. Folliculitis noted at nape of neck. HEENT: Head: normal shape and size; Eyes: sclera white, no icterus, conjunctiva pink, PERRLA and EOMs intact; Ears: bilateral cerumen impaction;  Throat/Mouth: Teeth present, mucosa pink and moist, no exudate, lesions or ulcerations noted.  Neck:  Neck supple, trachea midline. No masses,  lumps or thyromegaly present.  Cardiovascular: Normal rate and rhythm. S1,S2 noted.  No murmur, rubs or gallops noted. No JVD or BLE edema.  Pulmonary/Chest: Normal effort and positive vesicular breath sounds. No respiratory distress. No wheezes, rales or ronchi noted.  Abdomen: Soft and nontender. Normal bowel sounds. No distention or masses noted. Liver, spleen and kidneys non palpable. Musculoskeletal: Strength 5/5 BUE/BLE. No signs of joint swelling. No difficulty with gait.  Neurological: Alert and oriented. Cranial nerves  II-XII grossly intact. Coordination normal.  Psychiatric: Mood and affect normal. Behavior is normal. Judgment and thought content normal.     BMET    Component Value Date/Time   NA 137 05/13/2015 1417   K 3.7 05/13/2015 1417   CL 102 05/13/2015 1417   CO2 27 05/13/2015 1417   GLUCOSE 92 05/13/2015 1417   BUN 10 05/13/2015 1417   CREATININE 0.89 05/13/2015 1417   CALCIUM 9.5 05/13/2015 1417    Lipid Panel  No results found for: CHOL, TRIG, HDL, CHOLHDL, VLDL, LDLCALC  CBC No results found for: WBC, RBC, HGB, HCT, PLT, MCV, MCH, MCHC, RDW, LYMPHSABS, MONOABS, EOSABS, BASOSABS  Hgb A1C No results found for: HGBA1C       Assessment & Plan:   Preventative Health Care:  Flu shot UTD He declines Tetanus injection today Encouraged him to consume a balanced diet and start an exercise regimen Encouraged him to see a dentist at least annually He declines STD screen CBC, CMET, Lipid, A1C today  Bilateral cerumen impaction:  Lavage by CMA Advised him to try Debrox 2 x week to prevent wax buildup  RTC in 1 year, sooner if needed

## 2015-12-02 NOTE — Assessment & Plan Note (Signed)
Continue Prilosec

## 2015-12-02 NOTE — Progress Notes (Signed)
Pre visit review using our clinic review tool, if applicable. No additional management support is needed unless otherwise documented below in the visit note. 

## 2015-12-02 NOTE — Assessment & Plan Note (Signed)
Continue HCTZ

## 2015-12-02 NOTE — Patient Instructions (Signed)

## 2015-12-03 LAB — CBC
HCT: 46.1 % (ref 39.0–52.0)
Hemoglobin: 15.6 g/dL (ref 13.0–17.0)
MCHC: 33.7 g/dL (ref 30.0–36.0)
MCV: 89.1 fl (ref 78.0–100.0)
Platelets: 244 10*3/uL (ref 150.0–400.0)
RBC: 5.17 Mil/uL (ref 4.22–5.81)
RDW: 13.5 % (ref 11.5–15.5)
WBC: 9 10*3/uL (ref 4.0–10.5)

## 2015-12-03 LAB — COMPREHENSIVE METABOLIC PANEL
ALBUMIN: 4.2 g/dL (ref 3.5–5.2)
ALK PHOS: 46 U/L (ref 39–117)
ALT: 21 U/L (ref 0–53)
AST: 20 U/L (ref 0–37)
BUN: 9 mg/dL (ref 6–23)
CO2: 30 mEq/L (ref 19–32)
CREATININE: 1.01 mg/dL (ref 0.40–1.50)
Calcium: 8.8 mg/dL (ref 8.4–10.5)
Chloride: 100 mEq/L (ref 96–112)
GFR: 110.97 mL/min (ref >60.00)
GLUCOSE: 89 mg/dL (ref 70–99)
Potassium: 3.6 mEq/L (ref 3.5–5.1)
Sodium: 137 mEq/L (ref 135–145)
Total Bilirubin: 0.5 mg/dL (ref 0.2–1.2)
Total Protein: 8 g/dL (ref 6.0–8.3)

## 2015-12-03 LAB — LIPID PANEL
CHOLESTEROL: 180 mg/dL (ref 0–200)
HDL: 34 mg/dL — ABNORMAL LOW (ref >39.00)
LDL CALC: 129 mg/dL — AB (ref 0–99)
NONHDL: 145.84
Total CHOL/HDL Ratio: 5
Triglycerides: 83 mg/dL (ref 0.0–149.0)
VLDL: 16.6 mg/dL (ref 0.0–40.0)

## 2015-12-03 LAB — HEMOGLOBIN A1C: HEMOGLOBIN A1C: 5.7 % (ref 4.6–6.5)

## 2015-12-23 ENCOUNTER — Encounter: Payer: Self-pay | Admitting: Internal Medicine

## 2015-12-25 ENCOUNTER — Ambulatory Visit: Payer: Self-pay | Admitting: Internal Medicine

## 2016-02-10 ENCOUNTER — Encounter: Payer: Self-pay | Admitting: Internal Medicine

## 2016-02-10 ENCOUNTER — Other Ambulatory Visit: Payer: Self-pay | Admitting: Internal Medicine

## 2016-02-10 ENCOUNTER — Other Ambulatory Visit: Payer: Self-pay | Admitting: Family Medicine

## 2016-02-10 DIAGNOSIS — I10 Essential (primary) hypertension: Secondary | ICD-10-CM

## 2016-02-10 MED ORDER — HYDROCHLOROTHIAZIDE 25 MG PO TABS: 25.0000 mg | ORAL_TABLET | Freq: Every day | ORAL | Status: DC

## 2016-02-11 ENCOUNTER — Telehealth: Payer: BLUE CROSS/BLUE SHIELD | Admitting: Physician Assistant

## 2016-02-11 ENCOUNTER — Encounter: Payer: Self-pay | Admitting: Internal Medicine

## 2016-02-11 DIAGNOSIS — H00019 Hordeolum externum unspecified eye, unspecified eyelid: Secondary | ICD-10-CM

## 2016-02-11 MED ORDER — NEOMYCIN-POLYMYXIN-HC 3.5-10000-1 OP SUSP: OPHTHALMIC | Status: DC

## 2016-02-11 MED ORDER — ERYTHROMYCIN 5 MG/GM OP OINT: 1.0000 "application " | TOPICAL_OINTMENT | Freq: Three times a day (TID) | OPHTHALMIC | Status: DC

## 2016-02-11 NOTE — Telephone Encounter (Signed)
Baity pt, had an e-visit today was prescribed Cortisporin drops, pt cannot afford--per your verbal suggestion I have ordered the Erythromycin--please advise instructions and dose for instructions

## 2016-02-11 NOTE — Progress Notes (Signed)
We are sorry that you are not feeling well. Here is how we plan to help!  Based on what you have shared with me it looks like you have a stye.  A stye is an inflammation of the eyelid.  It is often a red, painful lump near the edge of the eyelid that may look like a boil or a pimple.  A stye develops when an infection occurs at the base of an eyelash.   We have made appropriate suggestions for you based upon your presentation:  The use of anti-inflammatory and antibiotic eye drops for a week will help resolve this condition.  I have sent in neomycin-polymyxin HC opthalmic suspension, two to three drops in the affected eye every 4 hours.  If your symptoms do not improve over the next two to three days you should be seen in your doctor's office.  HOME CARE:   Wash your hands often!  Let the stye open on its own. Don't squeeze or open it.  Don't rub your eyes. This can irritate your eyes and let in bacteria.  If you need to touch your eyes, wash your hands first.  Don't wear eye makeup or contact lenses until the area has healed.  GET HELP RIGHT AWAY IF:   Your symptoms do not improve.  You develop blurred or loss of vision.  Your symptoms worsen (increased discharge, pain or redness).  Thank you for choosing an e-visit.  Your e-visit answers were reviewed by a board certified advanced clinical practitioner to complete your personal care plan.  Depending upon the condition, your plan could have included both over the counter or prescription medications.  Please review your pharmacy choice.  Make sure the pharmacy is open so you can pick up prescription now.  If there is a problem, you may contact your provider through MyChart messaging and have the prescription routed to another pharmacy.    Your safety is important to us.  If you have drug allergies check your prescription carefully.  For the next 24 hours you can use MyChart to ask questions about today's visit, request a non-urgent  call back, or ask for a work or school excuse.  You will get an email in the next two days asking about your experience.  I hope you that your e-visit has been valuable and will speed your recovery.    

## 2016-02-12 ENCOUNTER — Ambulatory Visit: Payer: BLUE CROSS/BLUE SHIELD | Admitting: Primary Care

## 2016-02-12 DIAGNOSIS — Z0289 Encounter for other administrative examinations: Secondary | ICD-10-CM

## 2016-05-14 ENCOUNTER — Encounter: Payer: Self-pay | Admitting: Internal Medicine

## 2016-05-14 DIAGNOSIS — I10 Essential (primary) hypertension: Secondary | ICD-10-CM

## 2016-05-16 MED ORDER — HYDROCHLOROTHIAZIDE 25 MG PO TABS: 25.0000 mg | ORAL_TABLET | Freq: Every day | ORAL | 1 refills | Status: DC

## 2016-08-15 ENCOUNTER — Encounter: Payer: Self-pay | Admitting: Internal Medicine

## 2016-09-14 ENCOUNTER — Telehealth: Payer: Self-pay

## 2016-09-14 NOTE — Telephone Encounter (Signed)
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 Per DPR left v/m that pt should cb for appt.

## 2016-09-19 ENCOUNTER — Encounter: Payer: Self-pay | Admitting: Internal Medicine

## 2016-09-19 ENCOUNTER — Ambulatory Visit (INDEPENDENT_AMBULATORY_CARE_PROVIDER_SITE_OTHER): Payer: BC Managed Care – PPO | Admitting: Internal Medicine

## 2016-09-19 VITALS — BP 124/80 | HR 78 | Temp 98.2°F | Wt 305.0 lb

## 2016-09-19 DIAGNOSIS — Z0289 Encounter for other administrative examinations: Secondary | ICD-10-CM

## 2016-09-19 DIAGNOSIS — Z111 Encounter for screening for respiratory tuberculosis: Secondary | ICD-10-CM

## 2016-09-19 DIAGNOSIS — Z23 Encounter for immunization: Secondary | ICD-10-CM

## 2016-09-19 NOTE — Addendum Note (Signed)
Addended by: Roena MaladyEVONTENNO, Maleya Leever Y on: 09/19/2016 05:16 PM   Modules accepted: Orders

## 2016-09-19 NOTE — Progress Notes (Signed)
Subjective:    Patient ID: Chris Banks, male    DOB: 11-13-1985, 31 y.o.   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 He also needs a TB test and tetanus injection.  Review of Systems      Past Medical History:  Diagnosis Date  . Allergy   . Asthma    childhood    Current Outpatient Prescriptions  Medication Sig Dispense Refill  . erythromycin (ILOTYCIN) ophthalmic ointment Place 1 application into the left eye 3 (three) times daily. 3.5 g 0  . hydrochlorothiazide (HYDRODIURIL) 25 MG tablet Take 1 tablet (25 mg total) by mouth daily. 90 tablet 1  . neomycin-polymyxin-hydrocortisone (CORTISPORIN) 3.5-10000-1 ophthalmic suspension Apply 2-3 drops into affected eye every 4 hours 7.5 mL 0  . omeprazole (PRILOSEC) 20 MG capsule Take 1 capsule (20 mg total) by mouth daily. (Patient taking differently: Take 20 mg by mouth as needed. ) 90 capsule 1   No current facility-administered medications for this visit.     No Known Allergies  Family History  Problem Relation Age of Onset  . Stroke Maternal Grandfather   . Hypertension Paternal Grandfather   . Cancer Neg Hx     Social History   Social History  . Marital status: Single    Spouse name: N/A  . Number of children: N/A  . Years of education: N/A   Occupational History  . Not on file.   Social History Main Topics  . Smoking status: Never Smoker  . Smokeless tobacco: Never Used  . Alcohol use 0.0 oz/week     Comment: occasional  . Drug use: No  . Sexual activity: Yes   Other Topics Concern  . Not on file   Social History Narrative  . No narrative on file     Constitutional: Denies fever, malaise, fatigue, headache or abrupt weight changes.  HEENT: Denies eye pain, eye redness, ear pain, ringing in the ears, wax buildup, runny nose, nasal congestion, bloody nose, or sore throat. Respiratory: Denies difficulty breathing, shortness of breath, cough or sputum  production.   Cardiovascular: Denies chest pain, chest tightness, palpitations or swelling in the hands or feet.  Gastrointestinal: Denies abdominal pain, bloating, constipation, diarrhea or blood in the stool.  GU: Denies urgency, frequency, pain with urination, burning sensation, blood in urine, odor or discharge. Musculoskeletal: Denies decrease in range of motion, difficulty with gait, muscle pain or joint pain and swelling.  Skin: Denies redness, rashes, lesions or ulcercations.  Neurological: Denies dizziness, difficulty with memory, difficulty with speech or problems with balance and coordination.  Psych: Denies anxiety, depression, SI/HI.  No other specific complaints in a complete review of systems (except as listed in HPI above).  Objective:   Physical Exam  BP 124/80   Pulse 78   Temp 98.2 F (36.8 C) (Oral)   Wt (!) 305 lb (138.3 kg)   SpO2 98%   BMI 41.95 kg/m  Wt Readings from Last 3 Encounters:  09/19/16 (!) 305 lb (138.3 kg)  12/02/15 297 lb (134.7 kg)  10/08/15 294 lb (133.4 kg)    General: Appears his stated age, obese in NAD. Neurological: Alert and oriented.  Psychiatric: Mood and affect normal. Behavior is normal. Judgment and thought content normal.     BMET    Component Value Date/Time   NA 137 12/02/2015 1624   K 3.6 12/02/2015 1624   CL 100 12/02/2015 1624  CO2 30 12/02/2015 1624   GLUCOSE 89 12/02/2015 1624   BUN 9 12/02/2015 1624   CREATININE 1.01 12/02/2015 1624   CALCIUM 8.8 12/02/2015 1624    Lipid Panel     Component Value Date/Time   CHOL 180 12/02/2015 1624   TRIG 83.0 12/02/2015 1624   HDL 34.00 (L) 12/02/2015 1624   CHOLHDL 5 12/02/2015 1624   VLDL 16.6 12/02/2015 1624   LDLCALC 129 (H) 12/02/2015 1624    CBC    Component Value Date/Time   WBC 9.0 12/02/2015 1624   RBC 5.17 12/02/2015 1624   HGB 15.6 12/02/2015 1624   HCT 46.1 12/02/2015 1624   PLT 244.0 12/02/2015 1624   MCV 89.1 12/02/2015 1624   MCHC 33.7  12/02/2015 1624   RDW 13.5 12/02/2015 1624    Hgb A1C Lab Results  Component Value Date   HGBA1C 5.7 12/02/2015        Assessment & Plan:   Encounter for form completion:  Form filled out, need TB results before faxing TB test and tetanus today  RTC in 2 months for your annual exam Nicki ReaperBAITY, REGINA, NP

## 2016-09-22 LAB — TB SKIN TEST
INDURATION: 0 mm
TB Skin Test: NEGATIVE

## 2016-09-22 NOTE — Progress Notes (Signed)
Pre visit review using our clinic review tool, if applicable. No additional management support is needed unless otherwise documented below in the visit note. 

## 2017-01-21 IMAGING — US ABDOMEN ULTRASOUND LIMITED
1 series · 14 of 25 positions shown · non-contrast
Comparison: None

CLINICAL DATA: Epigastric and RIGHT upper quadrant pain for 2 days

EXAM:
US ABDOMEN LIMITED - RIGHT UPPER QUADRANT

[Series 1: abdomen ultrasound limited · 0.22mm/px · 14 of 38 slices shown]
[im 1/38]
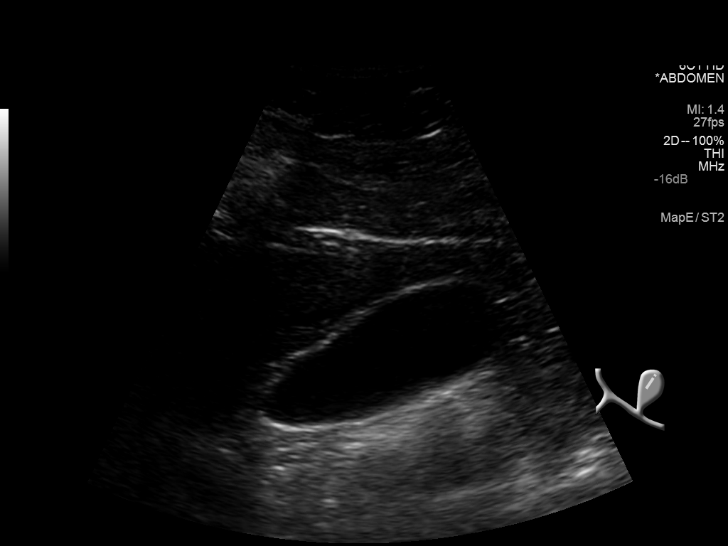
[im 4/38]
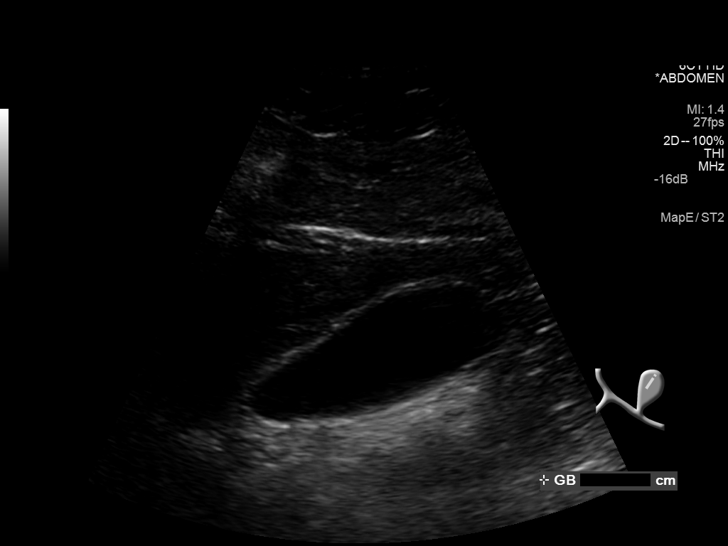
[im 7/38]
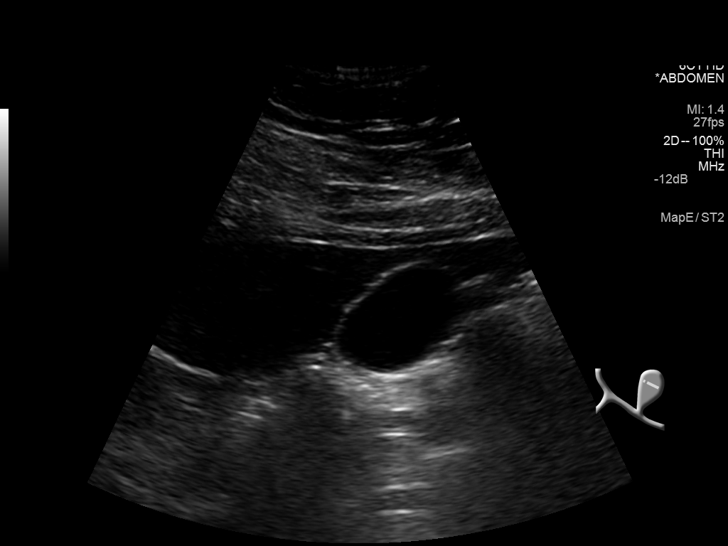
[im 10/38]
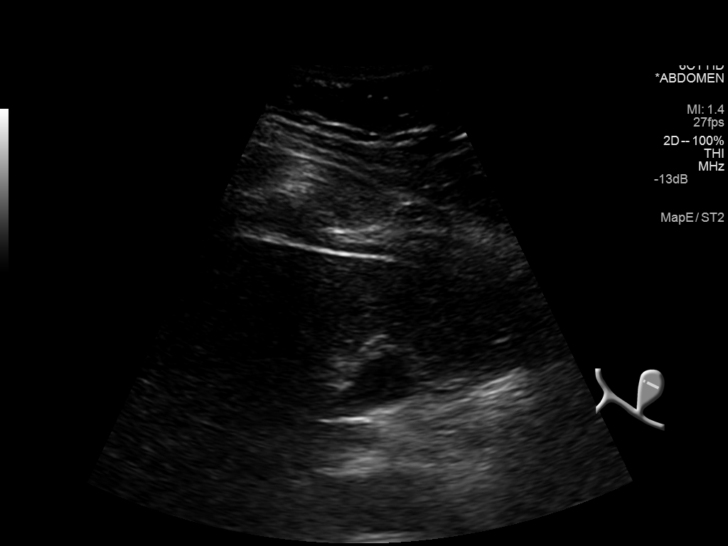
[im 13/38]
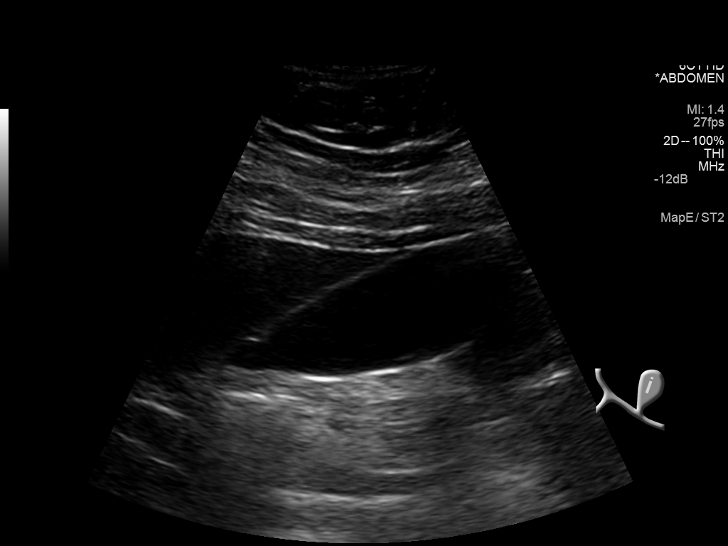
[im 14/38]
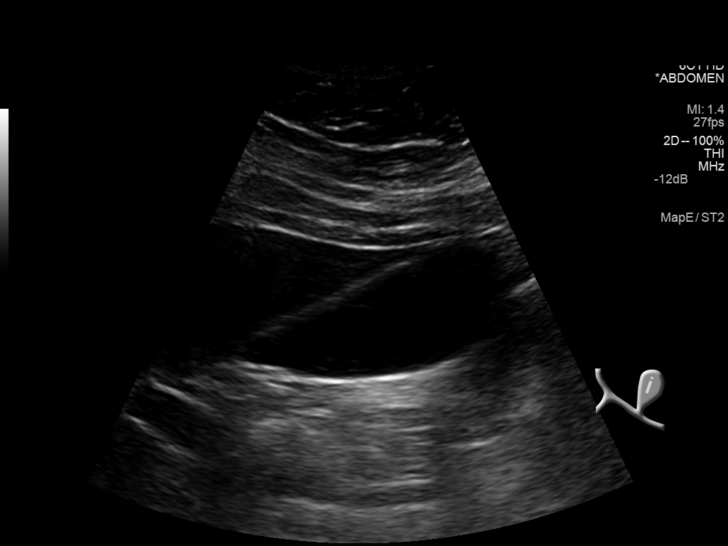
[im 17/38]
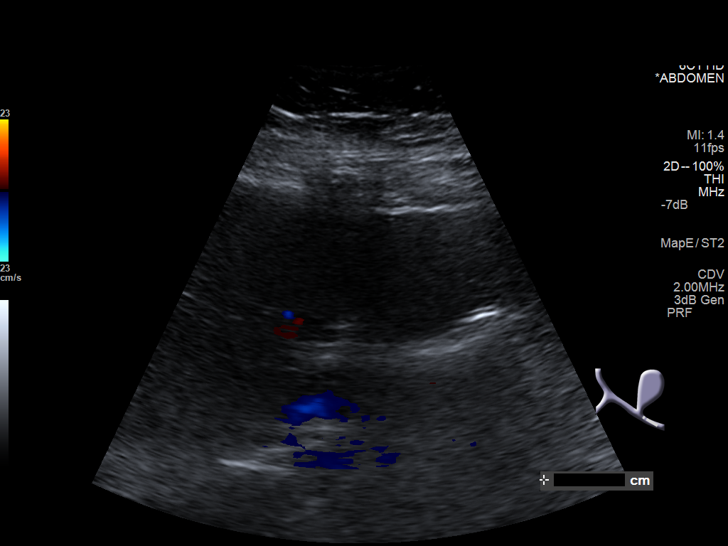
[im 21/38]
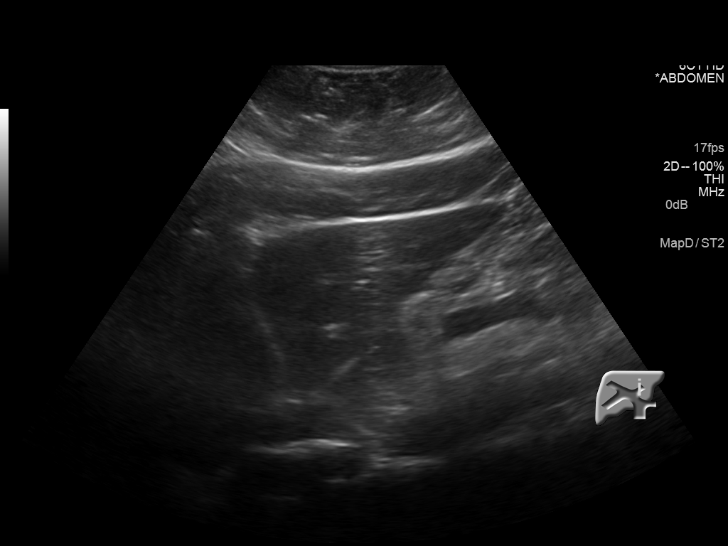
[im 24/38]
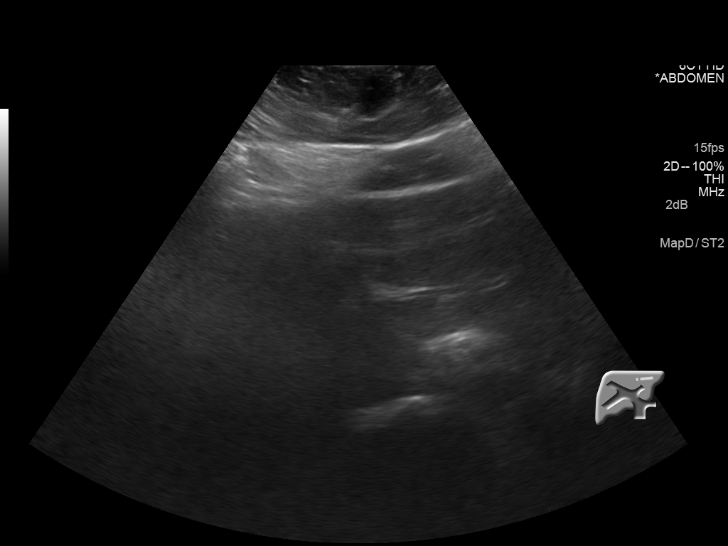
[im 25/38]
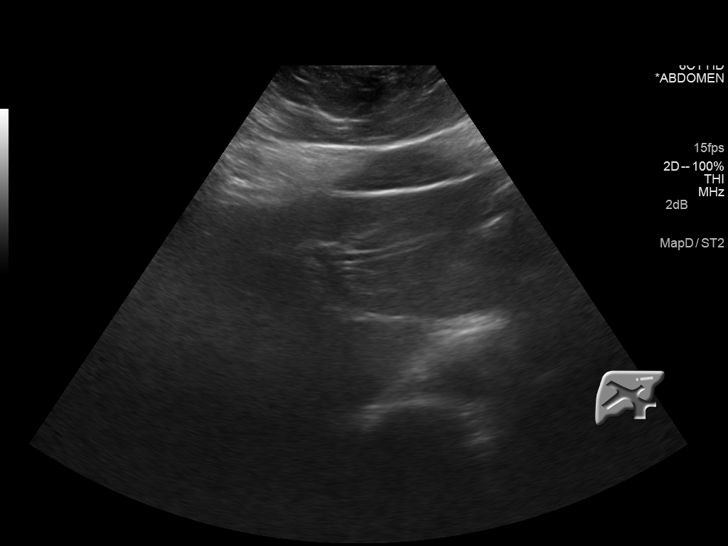
[im 28/38]
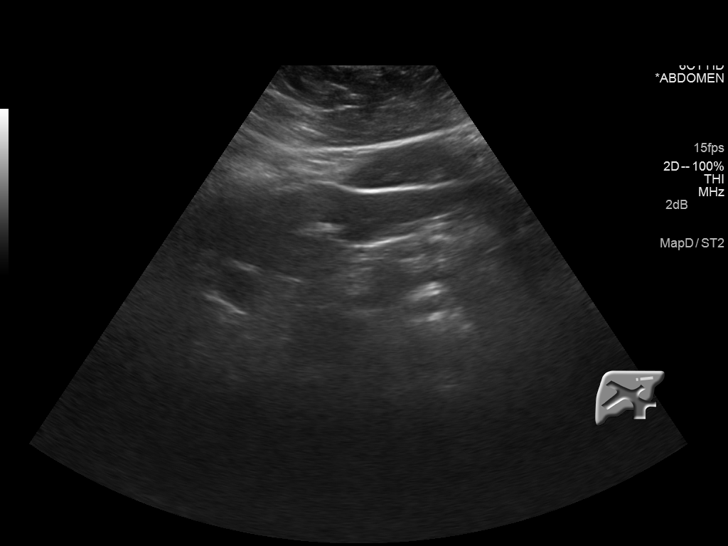
[im 31/38]
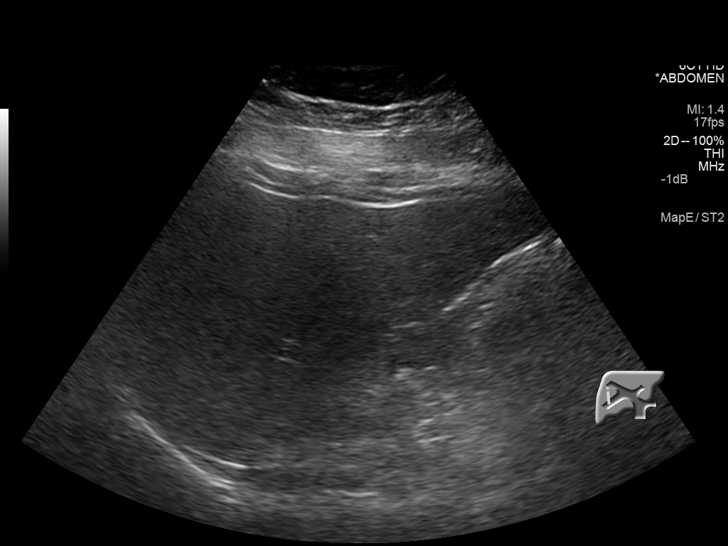
[im 34/38]
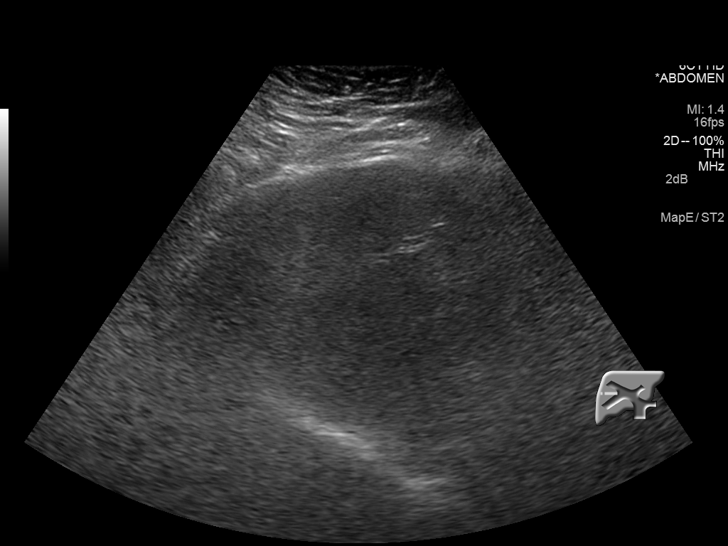
[im 38/38]
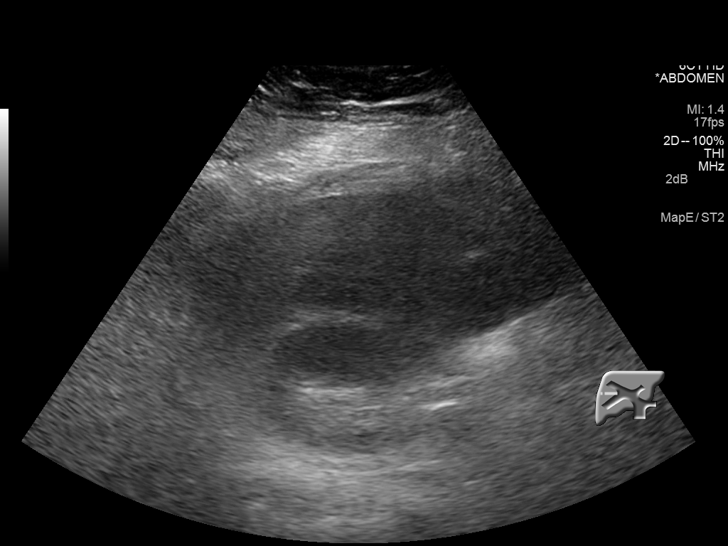

[14 of 25 positions shown; findings below may reference images not displayed]

FINDINGS: Gallbladder:

Normally distended without stones or wall thickening.

No pericholecystic fluid or sonographic Murphy sign.

Common bile duct:

Diameter: Normal caliber 3 mm diameter

Liver:

Suboptimal hepatic penetration due to body habitus and slightly
increased hepatic echogenicity, likely reflecting hepatic steatosis.
No gross hepatic mass or nodularity identified. Hepatopetal portal
venous flow.

No RIGHT upper quadrant ascites.
IMPRESSION: Probable fatty infiltration of liver as above.

Otherwise negative exam.

## 2017-02-07 ENCOUNTER — Encounter: Payer: Self-pay | Admitting: Internal Medicine

## 2017-02-07 DIAGNOSIS — I10 Essential (primary) hypertension: Secondary | ICD-10-CM

## 2017-02-09 MED ORDER — HYDROCHLOROTHIAZIDE 25 MG PO TABS: 25.0000 mg | ORAL_TABLET | Freq: Every day | ORAL | 0 refills | Status: DC

## 2017-05-19 ENCOUNTER — Telehealth: Payer: Self-pay | Admitting: Internal Medicine

## 2017-05-19 NOTE — Telephone Encounter (Signed)
Patient needs documentation that he had the Tdap done for school.  Patient wants the records faxed to [phone].  Patient wants to be called before records are sent.

## 2017-05-23 NOTE — Telephone Encounter (Signed)
Immunizations faxed yesterday... Left detailed msg on VM per HIPAA as I just read that pt wanted me to call before faxing.[name] KitchenMarland Kitchen

## 2017-06-05 DIAGNOSIS — Z23 Encounter for immunization: Secondary | ICD-10-CM | POA: Diagnosis not present

## 2017-08-07 ENCOUNTER — Other Ambulatory Visit: Payer: Self-pay | Admitting: Internal Medicine

## 2017-08-07 DIAGNOSIS — I10 Essential (primary) hypertension: Secondary | ICD-10-CM

## 2017-08-07 MED ORDER — HYDROCHLOROTHIAZIDE 25 MG PO TABS: 25.0000 mg | ORAL_TABLET | Freq: Every day | ORAL | 0 refills | Status: DC

## 2017-09-06 ENCOUNTER — Encounter: Payer: Self-pay | Admitting: Internal Medicine

## 2017-09-06 DIAGNOSIS — I10 Essential (primary) hypertension: Secondary | ICD-10-CM

## 2017-09-07 MED ORDER — HYDROCHLOROTHIAZIDE 25 MG PO TABS: 25.0000 mg | ORAL_TABLET | Freq: Every day | ORAL | 1 refills | Status: DC

## 2017-10-24 ENCOUNTER — Encounter: Payer: Self-pay | Admitting: Internal Medicine

## 2017-10-24 ENCOUNTER — Ambulatory Visit (INDEPENDENT_AMBULATORY_CARE_PROVIDER_SITE_OTHER): Payer: BLUE CROSS/BLUE SHIELD | Admitting: Internal Medicine

## 2017-10-24 VITALS — BP 152/96 | HR 102 | Temp 97.7°F | Ht 71.5 in | Wt 332.2 lb

## 2017-10-24 DIAGNOSIS — Z0001 Encounter for general adult medical examination with abnormal findings: Secondary | ICD-10-CM | POA: Diagnosis not present

## 2017-10-24 DIAGNOSIS — I10 Essential (primary) hypertension: Secondary | ICD-10-CM | POA: Diagnosis not present

## 2017-10-24 DIAGNOSIS — K219 Gastro-esophageal reflux disease without esophagitis: Secondary | ICD-10-CM

## 2017-10-24 DIAGNOSIS — B9789 Other viral agents as the cause of diseases classified elsewhere: Secondary | ICD-10-CM | POA: Diagnosis not present

## 2017-10-24 DIAGNOSIS — J069 Acute upper respiratory infection, unspecified: Secondary | ICD-10-CM

## 2017-10-24 DIAGNOSIS — E782 Mixed hyperlipidemia: Secondary | ICD-10-CM | POA: Diagnosis not present

## 2017-10-24 LAB — COMPREHENSIVE METABOLIC PANEL
ALT: 35 U/L (ref 0–53)
AST: 24 U/L (ref 0–37)
Albumin: 4.2 g/dL (ref 3.5–5.2)
Alkaline Phosphatase: 49 U/L (ref 39–117)
BILIRUBIN TOTAL: 0.3 mg/dL (ref 0.2–1.2)
BUN: 16 mg/dL (ref 6–23)
CHLORIDE: 102 meq/L (ref 96–112)
CO2: 27 meq/L (ref 19–32)
Calcium: 9.5 mg/dL (ref 8.4–10.5)
Creatinine, Ser: 1.02 mg/dL (ref 0.40–1.50)
GFR: 108.39 mL/min (ref >60.00)
Glucose, Bld: 118 mg/dL — ABNORMAL HIGH (ref 70–99)
POTASSIUM: 3.6 meq/L (ref 3.5–5.1)
Sodium: 137 mEq/L (ref 135–145)
Total Protein: 7.8 g/dL (ref 6.0–8.3)

## 2017-10-24 LAB — LIPID PANEL
Cholesterol: 206 mg/dL — ABNORMAL HIGH (ref 0–200)
HDL: 35.5 mg/dL — AB (ref >39.00)
NonHDL: 170.86
TRIGLYCERIDES: 318 mg/dL — AB (ref 0.0–149.0)
Total CHOL/HDL Ratio: 6
VLDL: 63.6 mg/dL — AB (ref 0.0–40.0)

## 2017-10-24 LAB — CBC
HEMATOCRIT: 46.1 % (ref 39.0–52.0)
HEMOGLOBIN: 15.8 g/dL (ref 13.0–17.0)
MCHC: 34.2 g/dL (ref 30.0–36.0)
MCV: 90.3 fl (ref 78.0–100.0)
PLATELETS: 244 10*3/uL (ref 150.0–400.0)
RBC: 5.1 Mil/uL (ref 4.22–5.81)
RDW: 13 % (ref 11.5–15.5)
WBC: 10.8 10*3/uL — ABNORMAL HIGH (ref 4.0–10.5)

## 2017-10-24 LAB — HEMOGLOBIN A1C: Hgb A1c MFr Bld: 5.8 % (ref 4.6–6.5)

## 2017-10-24 LAB — LDL CHOLESTEROL, DIRECT: Direct LDL: 151 mg/dL

## 2017-10-24 MED ORDER — HYDROCODONE-HOMATROPINE 5-1.5 MG/5ML PO SYRP: 5.0000 mL | ORAL_SOLUTION | Freq: Three times a day (TID) | ORAL | 0 refills | Status: DC | PRN

## 2017-10-24 NOTE — Progress Notes (Signed)
Subjective:    Patient ID: Chris Banks, male    DOB: 1986-01-22, 32 y.o.   MRN: [id number]  HPI  Pt presents to the clinic today for his annual exam. He is also due to follow up chronic conditions.  HTN: His BP today is 152/96. His is prescribed HCTZ but reports he does not think he took it yesterday or today.    GERD: Triggered by spicy food. He is taking Omeprazole as needed with good relief.  Flu: 05/2015 Tetanus: 09/2016 Dentist: as needed  Diet: He does eat meat. He consumes fruits and veggies. He rarely eats fried foods. He drinks mostly water. Exercise: He is working out for 30 minutes daily 5-6 days per week.  Review of Systems      Past Medical History:  Diagnosis Date  . Allergy   . Asthma    childhood    Current Outpatient Medications  Medication Sig Dispense Refill  . hydrochlorothiazide (HYDRODIURIL) 25 MG tablet Take 1 tablet (25 mg total) by mouth daily. 30 tablet 1  . omeprazole (PRILOSEC) 20 MG capsule Take 1 capsule (20 mg total) by mouth daily. (Patient taking differently: Take 20 mg by mouth as needed. ) 90 capsule 1   No current facility-administered medications for this visit.     No Known Allergies  Family History  Problem Relation Age of Onset  . Stroke Maternal Grandfather   . Hypertension Paternal Grandfather   . Cancer Neg Hx     Social History   Socioeconomic History  . Marital status: Single    Spouse name: Not on file  . Number of children: Not on file  . Years of education: Not on file  . Highest education level: Not on file  Social Needs  . Financial resource strain: Not on file  . Food insecurity - worry: Not on file  . Food insecurity - inability: Not on file  . Transportation needs - medical: Not on file  . Transportation needs - non-medical: Not on file  Occupational History  . Not on file  Tobacco Use  . Smoking status: Never Smoker  . Smokeless tobacco: Never Used  Substance and Sexual Activity  . Alcohol use:  Yes    Alcohol/week: 0.0 oz    Comment: occasional  . Drug use: No  . Sexual activity: Yes  Other Topics Concern  . Not on file  Social History Narrative  . Not on file     Constitutional: Denies fever, malaise, fatigue, headache or abrupt weight changes.  HEENT: Denies eye pain, eye redness, ear pain, ringing in the ears, wax buildup, runny nose, nasal congestion, bloody nose, or sore throat. Respiratory: Pt reports chest congestion. Denies difficulty breathing, shortness of breath, or sputum production.   Cardiovascular: Denies chest pain, chest tightness, palpitations or swelling in the hands or feet.  Gastrointestinal: Denies abdominal pain, bloating, constipation, diarrhea or blood in the stool.  GU: Denies urgency, frequency, pain with urination, burning sensation, blood in urine, odor or discharge. Musculoskeletal: Denies decrease in range of motion, difficulty with gait, muscle pain or joint pain and swelling.  Skin: Denies redness, rashes, lesions or ulcercations.  Neurological: Denies dizziness, difficulty with memory, difficulty with speech or problems with balance and coordination.  Psych: Denies anxiety, depression, SI/HI.  No other specific complaints in a complete review of systems (except as listed in HPI above).  Objective:   Physical Exam  BP (!) 152/96   Pulse (!) 102   Temp 97.7  F (36.5 C) (Oral)   Ht 5' 11.5" (1.816 m)   Wt (!) 332 lb 4 oz (150.7 kg)   SpO2 96%   BMI 45.69 kg/m  Wt Readings from Last 3 Encounters:  10/24/17 (!) 332 lb 4 oz (150.7 kg)  09/19/16 (!) 305 lb (138.3 kg)  12/02/15 297 lb (134.7 kg)    General: Appears his stated age, obese in NAD. Skin: Warm, dry and intact.  HEENT: Head: normal shape and size; Eyes: sclera white, no icterus, conjunctiva pink, PERRLA and EOMs intact;  Throat/Mouth: Teeth present, mucosa pink and moist, no exudate, lesions or ulcerations noted.  Neck:  Neck supple, trachea midline. No masses, lumps or  thyromegaly present.  Cardiovascular: Normal rate and rhythm. S1,S2 noted.  No murmur, rubs or gallops noted. No JVD or BLE edema.  Pulmonary/Chest: Normal effort and clear but diminished breath sounds. No respiratory distress. No wheezes, rales or ronchi noted.  Abdomen: Soft and nontender. Normal bowel sounds. No distention or masses noted. Liver, spleen and kidneys non palpable. Musculoskeletal: Strength 5/5 BUE/BLE. No difficulty with gait.  Neurological: Alert and oriented. Cranial nerves II-XII grossly intact. Coordination normal.  Psychiatric: Mood and affect normal. Behavior is normal. Judgment and thought content normal.    BMET    Component Value Date/Time   NA 137 12/02/2015 1624   K 3.6 12/02/2015 1624   CL 100 12/02/2015 1624   CO2 30 12/02/2015 1624   GLUCOSE 89 12/02/2015 1624   BUN 9 12/02/2015 1624   CREATININE 1.01 12/02/2015 1624   CALCIUM 8.8 12/02/2015 1624    Lipid Panel     Component Value Date/Time   CHOL 180 12/02/2015 1624   TRIG 83.0 12/02/2015 1624   HDL 34.00 (L) 12/02/2015 1624   CHOLHDL 5 12/02/2015 1624   VLDL 16.6 12/02/2015 1624   LDLCALC 129 (H) 12/02/2015 1624    CBC    Component Value Date/Time   WBC 9.0 12/02/2015 1624   RBC 5.17 12/02/2015 1624   HGB 15.6 12/02/2015 1624   HCT 46.1 12/02/2015 1624   PLT 244.0 12/02/2015 1624   MCV 89.1 12/02/2015 1624   MCHC 33.7 12/02/2015 1624   RDW 13.5 12/02/2015 1624    Hgb A1C Lab Results  Component Value Date   HGBA1C 5.7 12/02/2015            Assessment & Plan:   Preventative Health Maintenance:  He declines flu shot  Tetanus UTD Encouraged him to consume a balanced diet and exercise regimen Advised him to see an eye doctor and dentist annually Will check CBC, CMET, Lipid and A1C today  Viral Cough:  Mucinex 600 mg every 12 hours as needed eRx for Hycodan cough syrup  Return precautions dicsussed Nicki Reaper, NP

## 2017-10-24 NOTE — Assessment & Plan Note (Signed)
Advised him the importance of taking his medication daily CBC and CMET today Continue HCTZ Discussed DASH diet and exercise for weight loss

## 2017-10-24 NOTE — Patient Instructions (Signed)

## 2017-10-24 NOTE — Assessment & Plan Note (Signed)
Controlled on Omeprazole prn CBC and CMET today Discussed how weight loss could help improve reflux

## 2017-11-05 ENCOUNTER — Encounter: Payer: Self-pay | Admitting: Internal Medicine

## 2017-11-06 ENCOUNTER — Encounter: Payer: Self-pay | Admitting: Internal Medicine

## 2017-11-06 DIAGNOSIS — I10 Essential (primary) hypertension: Secondary | ICD-10-CM

## 2017-11-06 DIAGNOSIS — E782 Mixed hyperlipidemia: Secondary | ICD-10-CM

## 2017-11-07 MED ORDER — HYDROCHLOROTHIAZIDE 25 MG PO TABS: 25.0000 mg | ORAL_TABLET | Freq: Every day | ORAL | 1 refills | Status: DC

## 2017-11-07 MED ORDER — ATORVASTATIN CALCIUM 10 MG PO TABS: 10.0000 mg | ORAL_TABLET | Freq: Every day | ORAL | 2 refills | Status: DC

## 2017-11-07 NOTE — Addendum Note (Signed)
Addended by: Roena MaladyEVONTENNO, Jemya Depierro Y on: 11/07/2017 03:50 PM   Modules accepted: Orders

## 2018-01-03 ENCOUNTER — Encounter: Payer: Self-pay | Admitting: Internal Medicine

## 2018-01-03 DIAGNOSIS — I10 Essential (primary) hypertension: Secondary | ICD-10-CM

## 2018-01-03 MED ORDER — HYDROCHLOROTHIAZIDE 25 MG PO TABS: 25.0000 mg | ORAL_TABLET | Freq: Every day | ORAL | 0 refills | Status: DC

## 2018-02-02 ENCOUNTER — Encounter: Payer: Self-pay | Admitting: Internal Medicine

## 2018-02-02 DIAGNOSIS — I10 Essential (primary) hypertension: Secondary | ICD-10-CM

## 2018-02-02 MED ORDER — HYDROCHLOROTHIAZIDE 25 MG PO TABS: 25.0000 mg | ORAL_TABLET | Freq: Every day | ORAL | 0 refills | Status: DC

## 2018-02-02 MED ORDER — ATORVASTATIN CALCIUM 10 MG PO TABS: 10.0000 mg | ORAL_TABLET | Freq: Every day | ORAL | 0 refills | Status: DC

## 2018-02-07 ENCOUNTER — Other Ambulatory Visit (INDEPENDENT_AMBULATORY_CARE_PROVIDER_SITE_OTHER): Payer: BLUE CROSS/BLUE SHIELD

## 2018-02-07 DIAGNOSIS — E782 Mixed hyperlipidemia: Secondary | ICD-10-CM | POA: Diagnosis not present

## 2018-02-07 LAB — LIPID PANEL
CHOL/HDL RATIO: 4
CHOLESTEROL: 139 mg/dL (ref 0–200)
HDL: 36.2 mg/dL — ABNORMAL LOW (ref >39.00)
LDL CALC: 82 mg/dL (ref 0–99)
NONHDL: 102.55
Triglycerides: 102 mg/dL (ref 0.0–149.0)
VLDL: 20.4 mg/dL (ref 0.0–40.0)

## 2018-02-07 LAB — COMPREHENSIVE METABOLIC PANEL
ALBUMIN: 4.1 g/dL (ref 3.5–5.2)
ALT: 31 U/L (ref 0–53)
AST: 28 U/L (ref 0–37)
Alkaline Phosphatase: 47 U/L (ref 39–117)
BUN: 15 mg/dL (ref 6–23)
CHLORIDE: 102 meq/L (ref 96–112)
CO2: 28 meq/L (ref 19–32)
CREATININE: 1.02 mg/dL (ref 0.40–1.50)
Calcium: 9.3 mg/dL (ref 8.4–10.5)
GFR: 108.2 mL/min (ref >60.00)
GLUCOSE: 95 mg/dL (ref 70–99)
POTASSIUM: 3.6 meq/L (ref 3.5–5.1)
SODIUM: 140 meq/L (ref 135–145)
Total Bilirubin: 0.5 mg/dL (ref 0.2–1.2)
Total Protein: 7.5 g/dL (ref 6.0–8.3)

## 2018-03-05 ENCOUNTER — Other Ambulatory Visit: Payer: Self-pay | Admitting: Internal Medicine

## 2018-03-05 DIAGNOSIS — I10 Essential (primary) hypertension: Secondary | ICD-10-CM

## 2018-03-06 ENCOUNTER — Other Ambulatory Visit: Payer: Self-pay | Admitting: Internal Medicine

## 2018-03-06 ENCOUNTER — Encounter: Payer: Self-pay | Admitting: Internal Medicine

## 2018-03-06 DIAGNOSIS — K219 Gastro-esophageal reflux disease without esophagitis: Secondary | ICD-10-CM

## 2018-03-06 DIAGNOSIS — I10 Essential (primary) hypertension: Secondary | ICD-10-CM

## 2018-03-06 MED ORDER — HYDROCHLOROTHIAZIDE 25 MG PO TABS: 25.0000 mg | ORAL_TABLET | Freq: Every day | ORAL | 2 refills | Status: DC

## 2018-03-06 MED ORDER — ATORVASTATIN CALCIUM 10 MG PO TABS: 10.0000 mg | ORAL_TABLET | Freq: Every day | ORAL | 2 refills | Status: DC

## 2018-03-06 MED ORDER — OMEPRAZOLE 20 MG PO CPDR: 20.0000 mg | DELAYED_RELEASE_CAPSULE | Freq: Every day | ORAL | 2 refills | Status: DC

## 2018-09-08 ENCOUNTER — Encounter: Payer: Self-pay | Admitting: Internal Medicine

## 2018-09-10 ENCOUNTER — Encounter: Payer: Self-pay | Admitting: Internal Medicine

## 2018-09-10 ENCOUNTER — Ambulatory Visit (INDEPENDENT_AMBULATORY_CARE_PROVIDER_SITE_OTHER): Payer: BLUE CROSS/BLUE SHIELD | Admitting: Internal Medicine

## 2018-09-10 VITALS — BP 148/96 | HR 98 | Temp 97.9°F | Wt 335.0 lb

## 2018-09-10 DIAGNOSIS — K0889 Other specified disorders of teeth and supporting structures: Secondary | ICD-10-CM | POA: Diagnosis not present

## 2018-09-10 MED ORDER — NAPROXEN 500 MG PO TABS: 500.0000 mg | ORAL_TABLET | Freq: Two times a day (BID) | ORAL | 0 refills | Status: DC

## 2018-09-10 MED ORDER — AMOXICILLIN 500 MG PO CAPS: 500.0000 mg | ORAL_CAPSULE | Freq: Three times a day (TID) | ORAL | 0 refills | Status: DC

## 2018-09-10 NOTE — Patient Instructions (Signed)
Dental Pain  Dental pain may be caused by many things, including:  Tooth decay (cavities or caries).  Infection.  The inner part of the tooth being filled with pus (abscess).  Injury.  Sometimes the cause of pain is unknown.  Your pain can vary. It may be mild or severe. You may have it all the time, or it may occur only when you are:  Chewing.  Exposed to hot or cold temperature.  Eating or drinking sugary foods or beverages, such as soda or candy.  Follow these instructions at home:  Medicines  Take over-the-counter and prescription medicines only as told by your doctor.  If you were prescribed an antibiotic medicine, take it as told by your doctor. Do not stop taking the medicine even if you start to feel better.  Eating and drinking  Do not eat foods or drinks that cause you pain. These include:  Very hot or very cold foods or drinks.  Sweet or sugary foods or drinks.  Managing pain and swelling    Gargle with a salt-water mixture 3-4 times a day. To make this, dissolve -1 tsp of salt in 1 cup of warm water.  If told, put ice on the painful area of your face:  Put ice in a plastic bag.  Place a towel between your skin and the bag.  Leave the ice on for 20 minutes, 2-3 times a day.  Brushing your teeth  Brush your teeth twice a day using a fluoride toothpaste.  Floss your teeth once a day.  Use a toothpaste made for sensitive teeth as told by your doctor.  Use a soft toothbrush.  General instructions  Do not apply heat to the outside of your face.  Watch your dental pain. Let your doctor know if there are any changes.  Keep all follow-up visits as told by your doctor. This is important.  Contact a doctor if:  Your pain is not relieved by medicines.  You have new symptoms.  Your symptoms get worse.  Get help right away if:  You cannot open your mouth.  You are having trouble breathing or swallowing.  You have a fever.  Your face, neck, or jaw is swollen.  Summary  Dental pain may be caused by many things,  including tooth decay, injury, or infection. In some cases, the cause is not known.  Your pain may be mild or severe. You may have pain all the time, or you may have it only when you eat or drink.  Take over-the-counter and prescription medicines only as told by your doctor.  Watch your dental pain for any changes. Let your doctor know if symptoms get worse.  This information is not intended to replace advice given to you by your health care provider. Make sure you discuss any questions you have with your health care provider.  Document Released: 01/11/2008 Document Revised: 08/07/2017 Document Reviewed: 08/07/2017  Elsevier Interactive Patient Education  2019 [employer]

## 2018-09-10 NOTE — Progress Notes (Signed)
Subjective:    Patient ID: Chris Banks, male    DOB: 02-Apr-1986, 33 y.o.   MRN: [id number]  HPI  Pt presents to the clinic today with c/o dental pain. He reports this started 3-4 days ago. He describes the pain as pressure in the left side of his jaw. He has not noticed any drainage or discharge. He has been using salt water gargles, rinsing with Peroxide, Tylenol and Aleve with some relief. He reports he has an appt with his dentist 2/7.   Review of Systems      Past Medical History:  Diagnosis Date  . Allergy   . Asthma    childhood    Current Outpatient Medications  Medication Sig Dispense Refill  . atorvastatin (LIPITOR) 10 MG tablet Take 1 tablet (10 mg total) by mouth daily. 90 tablet 2  . hydrochlorothiazide (HYDRODIURIL) 25 MG tablet Take 1 tablet (25 mg total) by mouth daily. 90 tablet 2  . HYDROcodone-homatropine (HYCODAN) 5-1.5 MG/5ML syrup Take 5 mLs by mouth every 8 (eight) hours as needed for cough. 120 mL 0  . omeprazole (PRILOSEC) 20 MG capsule Take 1 capsule (20 mg total) by mouth daily. 90 capsule 2   No current facility-administered medications for this visit.     No Known Allergies  Family History  Problem Relation Age of Onset  . Stroke Maternal Grandfather   . Hypertension Paternal Grandfather   . Cancer Neg Hx     Social History   Socioeconomic History  . Marital status: Single    Spouse name: Not on file  . Number of children: Not on file  . Years of education: Not on file  . Highest education level: Not on file  Occupational History  . Not on file  Social Needs  . Financial resource strain: Not on file  . Food insecurity:    Worry: Not on file    Inability: Not on file  . Transportation needs:    Medical: Not on file    Non-medical: Not on file  Tobacco Use  . Smoking status: Never Smoker  . Smokeless tobacco: Never Used  Substance and Sexual Activity  . Alcohol use: Yes    Alcohol/week: 0.0 standard drinks    Comment:  occasional  . Drug use: No  . Sexual activity: Yes  Lifestyle  . Physical activity:    Days per week: Not on file    Minutes per session: Not on file  . Stress: Not on file  Relationships  . Social connections:    Talks on phone: Not on file    Gets together: Not on file    Attends religious service: Not on file    Active member of club or organization: Not on file    Attends meetings of clubs or organizations: Not on file    Relationship status: Not on file  . Intimate partner violence:    Fear of current or ex partner: Not on file    Emotionally abused: Not on file    Physically abused: Not on file    Forced sexual activity: Not on file  Other Topics Concern  . Not on file  Social History Narrative  . Not on file     Constitutional: Denies fever, malaise, fatigue, headache or abrupt weight changes.  HEENT: Pt reports dental pain. Denies eye pain, eye redness, ear pain, ringing in the ears, wax buildup, runny nose, nasal congestion, bloody nose, or sore throat. Respiratory: Denies difficulty breathing, shortness  of breath, cough or sputum production.   Cardiovascular: Denies chest pain, chest tightness, palpitations or swelling in the hands or feet.    No other specific complaints in a complete review of systems (except as listed in HPI above).  Objective:   Physical Exam   BP (!) 148/96   Pulse 98   Temp 97.9 F (36.6 C) (Oral)   Wt (!) 335 lb (152 kg)   SpO2 97%   BMI 46.07 kg/m  Wt Readings from Last 3 Encounters:  09/10/18 (!) 335 lb (152 kg)  10/24/17 (!) 332 lb 4 oz (150.7 kg)  09/19/16 (!) 305 lb (138.3 kg)    General: Appears his stated age, obese, in NAD. HEENT: Head: normal shape and size; Throat/Mouth: Teeth present, mucosa pink and moist, no exudate, lesions or ulcerations noted. Some swelling noted around left lower impacted wisdom tooth. Neck:  No adenopathy today. Cardiovascular: Normal rate and rhythm. S1,S2 noted.  No murmur, rubs or gallops  noted.  Pulmonary/Chest: Normal effort and positive vesicular breath sounds. No respiratory distress. No wheezes, rales or ronchi noted.   BMET    Component Value Date/Time   NA 140 02/07/2018 0859   K 3.6 02/07/2018 0859   CL 102 02/07/2018 0859   CO2 28 02/07/2018 0859   GLUCOSE 95 02/07/2018 0859   BUN 15 02/07/2018 0859   CREATININE 1.02 02/07/2018 0859   CALCIUM 9.3 02/07/2018 0859    Lipid Panel     Component Value Date/Time   CHOL 139 02/07/2018 0859   TRIG 102.0 02/07/2018 0859   HDL 36.20 (L) 02/07/2018 0859   CHOLHDL 4 02/07/2018 0859   VLDL 20.4 02/07/2018 0859   LDLCALC 82 02/07/2018 0859    CBC    Component Value Date/Time   WBC 10.8 (H) 10/24/2017 1427   RBC 5.10 10/24/2017 1427   HGB 15.8 10/24/2017 1427   HCT 46.1 10/24/2017 1427   PLT 244.0 10/24/2017 1427   MCV 90.3 10/24/2017 1427   MCHC 34.2 10/24/2017 1427   RDW 13.0 10/24/2017 1427    Hgb A1C Lab Results  Component Value Date   HGBA1C 5.8 10/24/2017           Assessment & Plan:   Dental Pain:  ? Coming from impacted wisdom tooth RX for Naproxen 500 mg BID with food Will cover for possible abscess with Amoxil 500 mg TID x 10 days Follow up with dentist as scheduled.  Return precautions discussed Nicki Reaperegina , NP

## 2018-10-20 ENCOUNTER — Encounter: Payer: Self-pay | Admitting: Internal Medicine

## 2018-10-20 DIAGNOSIS — I10 Essential (primary) hypertension: Secondary | ICD-10-CM

## 2018-10-20 DIAGNOSIS — K219 Gastro-esophageal reflux disease without esophagitis: Secondary | ICD-10-CM

## 2018-10-23 MED ORDER — HYDROCHLOROTHIAZIDE 25 MG PO TABS: 25.0000 mg | ORAL_TABLET | Freq: Every day | ORAL | 0 refills | Status: DC

## 2018-10-23 MED ORDER — OMEPRAZOLE 20 MG PO CPDR: 20.0000 mg | DELAYED_RELEASE_CAPSULE | Freq: Every day | ORAL | 0 refills | Status: DC

## 2018-10-23 MED ORDER — ATORVASTATIN CALCIUM 10 MG PO TABS: 10.0000 mg | ORAL_TABLET | Freq: Every day | ORAL | 0 refills | Status: DC

## 2018-10-27 ENCOUNTER — Encounter: Payer: Self-pay | Admitting: Internal Medicine

## 2018-10-30 ENCOUNTER — Ambulatory Visit (INDEPENDENT_AMBULATORY_CARE_PROVIDER_SITE_OTHER): Payer: BLUE CROSS/BLUE SHIELD | Admitting: Internal Medicine

## 2018-10-30 ENCOUNTER — Other Ambulatory Visit: Payer: Self-pay

## 2018-10-30 ENCOUNTER — Encounter: Payer: BLUE CROSS/BLUE SHIELD | Admitting: Internal Medicine

## 2018-10-30 ENCOUNTER — Encounter: Payer: Self-pay | Admitting: Internal Medicine

## 2018-10-30 VITALS — BP 134/90 | HR 92 | Temp 98.0°F | Wt 327.0 lb

## 2018-10-30 DIAGNOSIS — K219 Gastro-esophageal reflux disease without esophagitis: Secondary | ICD-10-CM

## 2018-10-30 DIAGNOSIS — F419 Anxiety disorder, unspecified: Secondary | ICD-10-CM | POA: Diagnosis not present

## 2018-10-30 DIAGNOSIS — R110 Nausea: Secondary | ICD-10-CM | POA: Diagnosis not present

## 2018-10-30 DIAGNOSIS — R140 Abdominal distension (gaseous): Secondary | ICD-10-CM | POA: Diagnosis not present

## 2018-10-30 NOTE — Patient Instructions (Signed)
Nausea, Adult Nausea is feeling sick to your stomach or feeling that you are about to throw up (vomit). Feeling sick to your stomach is usually not serious, but it may be an early sign of a more serious medical problem. As you feel sicker to your stomach, you may throw up. If you throw up, or if you are not able to drink enough fluids, there is a risk that you may lose too much water in your body (get dehydrated). If you lose too much water in your body, you may:  Feel tired.  Feel thirsty.  Have a dry mouth.  Have cracked lips.  Go pee (urinate) less often. Older adults and people who have other diseases or a weak body defense system (immune system) have a higher risk of losing too much water in the body. The main goals of treating this condition are:  To relieve your nausea.  To ensure your nausea occurs less often.  To prevent throwing up and losing too much fluid. Follow these instructions at home: Watch your symptoms for any changes. Tell your doctor about them. Follow these instructions as told by your doctor. Eating and drinking      Take an ORS (oral rehydration solution). This is a drink that is sold at pharmacies and stores.  Drink clear fluids in small amounts as you are able. These include: ? Water. ? Ice chips. ? Fruit juice that has water added (diluted fruit juice). ? Low-calorie sports drinks.  Eat bland, easy-to-digest foods in small amounts as you are able, such as: ? Bananas. ? Applesauce. ? Rice. ? Low-fat (lean) meats. ? Toast. ? Crackers.  Avoid drinking fluids that have a lot of sugar or caffeine in them. This includes energy drinks, sports drinks, and soda.  Avoid alcohol.  Avoid spicy or fatty foods. General instructions  Take over-the-counter and prescription medicines only as told by your doctor.  Rest at home while you get better.  Drink enough fluid to keep your pee (urine) pale yellow.  Take slow and deep breaths when you feel  sick to your stomach.  Avoid food or things that have strong smells.  Wash your hands often with soap and water. If you cannot use soap and water, use hand sanitizer.  Make sure that all people in your home wash their hands well and often.  Keep all follow-up visits as told by your doctor. This is important. Contact a doctor if:  You feel sicker to your stomach.  You feel sick to your stomach for more than 2 days.  You throw up.  You are not able to drink fluids without throwing up.  You have new symptoms.  You have a fever.  You have a headache.  You have muscle cramps.  You have a rash.  You have pain while peeing.  You feel light-headed or dizzy. Get help right away if:  You have pain in your chest, neck, arm, or jaw.  You feel very weak or you pass out (faint).  You have throw up that is bright red or looks like coffee grounds.  You have bloody or black poop (stools) or poop that looks like tar.  You have a very bad headache, a stiff neck, or both.  You have very bad pain, cramping, or bloating in your belly (abdomen).  You have trouble breathing or you are breathing very quickly.  Your heart is beating very quickly.  Your skin feels cold and clammy.  You feel confused.    You have signs of losing too much water in your body, such as: ? Dark pee, very little pee, or no pee. ? Cracked lips. ? Dry mouth. ? Sunken eyes. ? Sleepiness. ? Weakness. These symptoms may be an emergency. Do not wait to see if the symptoms will go away. Get medical help right away. Call your local emergency services (911 in the [place] Do not drive yourself to the hospital. Summary  Nausea is feeling sick to your stomach or feeling that you are about to throw up (vomit).  If you throw up, or if you are not able to drink enough fluids, there is a risk that you may lose too much water in your body (get dehydrated).  Eat and drink what your doctor tells you. Take  over-the-counter and prescription medicines only as told by your doctor.  Contact a doctor right away if your symptoms get worse or you have new symptoms.  Keep all follow-up visits as told by your doctor. This is important. This information is not intended to replace advice given to you by your health care provider. Make sure you discuss any questions you have with your health care provider. Document Released: 07/14/2011 Document Revised: 01/02/2018 Document Reviewed: 01/02/2018 Elsevier Interactive Patient Education  2019 [employer]  

## 2018-10-30 NOTE — Addendum Note (Signed)
Addended by: Alvina Chou on: 10/30/2018 04:39 PM   Modules accepted: Orders

## 2018-10-30 NOTE — Telephone Encounter (Signed)
Pt called back having nausea, no appetite,and gassy.no fever, cough, SOB or abd pain. Pt has not traveled and no exposure to positive corona virus or flu. Pt scheduled 30' appt to see R Baity NP 10/30/18 at 4PM (only time pt could be seen today due to his schedule). FYI to R BaityNP.

## 2018-10-30 NOTE — Progress Notes (Signed)
Subjective:    Patient ID: Chris Banks, male    DOB: Apr 20, 1986, 33 y.o.   MRN: [id number]  HPI  Pt presents to the clinic today with c/o nausea, decreased appetite, gassiness and reflux. This started 4 days ago.  He vomited x 1. He denies cough, shortness of breath, diarrhea or blood in his stool. He denies fever, chills or nausea. He reports the symptoms are worse first thing in the morning and after eating. He has been taking Omeprazole twice daily with some relief. He denies recent changes in diet or medications. He does report recent increase in stress..  Review of Systems      Past Medical History:  Diagnosis Date  . Allergy   . Asthma    childhood    Current Outpatient Medications  Medication Sig Dispense Refill  . amoxicillin (AMOXIL) 500 MG capsule Take 1 capsule (500 mg total) by mouth 3 (three) times daily. 30 capsule 0  . atorvastatin (LIPITOR) 10 MG tablet Take 1 tablet (10 mg total) by mouth daily. 90 tablet 0  . hydrochlorothiazide (HYDRODIURIL) 25 MG tablet Take 1 tablet (25 mg total) by mouth daily. 90 tablet 0  . naproxen (NAPROSYN) 500 MG tablet Take 1 tablet (500 mg total) by mouth 2 (two) times daily with a meal. 14 tablet 0  . omeprazole (PRILOSEC) 20 MG capsule Take 1 capsule (20 mg total) by mouth daily. 90 capsule 0   No current facility-administered medications for this visit.     No Known Allergies  Family History  Problem Relation Age of Onset  . Stroke Maternal Grandfather   . Hypertension Paternal Grandfather   . Cancer Neg Hx     Social History   Socioeconomic History  . Marital status: Single    Spouse name: Not on file  . Number of children: Not on file  . Years of education: Not on file  . Highest education level: Not on file  Occupational History  . Not on file  Social Needs  . Financial resource strain: Not on file  . Food insecurity:    Worry: Not on file    Inability: Not on file  . Transportation needs:    Medical: Not  on file    Non-medical: Not on file  Tobacco Use  . Smoking status: Never Smoker  . Smokeless tobacco: Never Used  Substance and Sexual Activity  . Alcohol use: Yes    Alcohol/week: 0.0 standard drinks    Comment: occasional  . Drug use: No  . Sexual activity: Yes  Lifestyle  . Physical activity:    Days per week: Not on file    Minutes per session: Not on file  . Stress: Not on file  Relationships  . Social connections:    Talks on phone: Not on file    Gets together: Not on file    Attends religious service: Not on file    Active member of club or organization: Not on file    Attends meetings of clubs or organizations: Not on file    Relationship status: Not on file  . Intimate partner violence:    Fear of current or ex partner: Not on file    Emotionally abused: Not on file    Physically abused: Not on file    Forced sexual activity: Not on file  Other Topics Concern  . Not on file  Social History Narrative  . Not on file     Constitutional: Denies fever,  malaise, fatigue, headache or abrupt weight changes.  Respiratory: Denies difficulty breathing, shortness of breath, cough or sputum production.   Cardiovascular: Denies chest pain, chest tightness, palpitations or swelling in the hands or feet.  Gastrointestinal: Pt reports nausea, gas, bloating. Denies abdominal pain, constipation, diarrhea or blood in the stool.  GU: Denies urgency, frequency, pain with urination, burning sensation, blood in urine, odor or discharge. Neurological: Denies dizziness, difficulty with memory, difficulty with speech or problems with balance and coordination.  Psych: Pt reports stress and anxiety. Denies depression, SI/HI.  No other specific complaints in a complete review of systems (except as listed in HPI above).  Objective:   Physical Exam   BP 134/90   Pulse 92   Temp 98 F (36.7 C) (Oral)   Wt (!) 327 lb (148.3 kg)   SpO2 98%   BMI 44.97 kg/m  Wt Readings from Last 3  Encounters:  10/30/18 (!) 327 lb (148.3 kg)  09/10/18 (!) 335 lb (152 kg)  10/24/17 (!) 332 lb 4 oz (150.7 kg)    General: Appears his stated age, obese, in NAD. Cardiovascular: Normal rate and rhythm. S1,S2 noted.  No murmur, rubs or gallops noted.  Pulmonary/Chest: Normal effort and positive vesicular breath sounds. No respiratory distress. No wheezes, rales or ronchi noted.  Abdomen: Soft and nontender. Normal bowel sounds. No distention or masses noted.  Neurological: Alert and oriented.   Psychiatric: Anxious appearing.  BMET    Component Value Date/Time   NA 140 02/07/2018 0859   K 3.6 02/07/2018 0859   CL 102 02/07/2018 0859   CO2 28 02/07/2018 0859   GLUCOSE 95 02/07/2018 0859   BUN 15 02/07/2018 0859   CREATININE 1.02 02/07/2018 0859   CALCIUM 9.3 02/07/2018 0859    Lipid Panel     Component Value Date/Time   CHOL 139 02/07/2018 0859   TRIG 102.0 02/07/2018 0859   HDL 36.20 (L) 02/07/2018 0859   CHOLHDL 4 02/07/2018 0859   VLDL 20.4 02/07/2018 0859   LDLCALC 82 02/07/2018 0859    CBC    Component Value Date/Time   WBC 10.8 (H) 10/24/2017 1427   RBC 5.10 10/24/2017 1427   HGB 15.8 10/24/2017 1427   HCT 46.1 10/24/2017 1427   PLT 244.0 10/24/2017 1427   MCV 90.3 10/24/2017 1427   MCHC 34.2 10/24/2017 1427   RDW 13.0 10/24/2017 1427    Hgb A1C Lab Results  Component Value Date   HGBA1C 5.8 10/24/2017          Assessment & Plan:   Nausea, Gassiness, GERD:  Will check CBC, CMET, H pylori Stool Antigen Continue Omeprazole BID for now  Anxiety, Stress:  Referral to psych placed for counseling  Return precautions discussed Nicki Reaper, NP

## 2018-10-31 LAB — CBC
HEMATOCRIT: 49 % (ref 39.0–52.0)
Hemoglobin: 16.9 g/dL (ref 13.0–17.0)
MCHC: 34.6 g/dL (ref 30.0–36.0)
MCV: 88.5 fl (ref 78.0–100.0)
Platelets: 258 10*3/uL (ref 150.0–400.0)
RBC: 5.54 Mil/uL (ref 4.22–5.81)
RDW: 13.2 % (ref 11.5–15.5)
WBC: 10.3 10*3/uL (ref 4.0–10.5)

## 2018-10-31 LAB — COMPREHENSIVE METABOLIC PANEL
ALT: 31 U/L (ref 0–53)
AST: 22 U/L (ref 0–37)
Albumin: 4.5 g/dL (ref 3.5–5.2)
Alkaline Phosphatase: 63 U/L (ref 39–117)
BUN: 13 mg/dL (ref 6–23)
CHLORIDE: 102 meq/L (ref 96–112)
CO2: 20 meq/L (ref 19–32)
Calcium: 9.9 mg/dL (ref 8.4–10.5)
Creatinine, Ser: 1.2 mg/dL (ref 0.40–1.50)
GFR: 84.02 mL/min (ref >60.00)
GLUCOSE: 89 mg/dL (ref 70–99)
POTASSIUM: 3.2 meq/L — AB (ref 3.5–5.1)
Sodium: 136 mEq/L (ref 135–145)
Total Bilirubin: 0.6 mg/dL (ref 0.2–1.2)
Total Protein: 8.4 g/dL — ABNORMAL HIGH (ref 6.0–8.3)

## 2018-11-01 ENCOUNTER — Other Ambulatory Visit: Payer: BLUE CROSS/BLUE SHIELD

## 2018-11-01 ENCOUNTER — Encounter: Payer: Self-pay | Admitting: Internal Medicine

## 2018-11-01 DIAGNOSIS — R110 Nausea: Secondary | ICD-10-CM

## 2018-11-01 DIAGNOSIS — K219 Gastro-esophageal reflux disease without esophagitis: Secondary | ICD-10-CM

## 2018-11-01 DIAGNOSIS — R140 Abdominal distension (gaseous): Secondary | ICD-10-CM

## 2018-11-02 ENCOUNTER — Encounter: Payer: Self-pay | Admitting: Internal Medicine

## 2018-11-02 LAB — HELICOBACTER PYLORI  SPECIAL ANTIGEN
MICRO NUMBER:: 355690
SPECIMEN QUALITY: ADEQUATE

## 2018-11-02 MED ORDER — ONDANSETRON HCL 4 MG PO TABS: 4.0000 mg | ORAL_TABLET | Freq: Three times a day (TID) | ORAL | 0 refills | Status: DC | PRN

## 2018-11-02 NOTE — Telephone Encounter (Signed)
Dr Alphonsus Sias, can you look at this since Rene Kocher is out of the office today?

## 2018-11-05 MED ORDER — ONDANSETRON HCL 4 MG PO TABS: 4.0000 mg | ORAL_TABLET | Freq: Three times a day (TID) | ORAL | 0 refills | Status: DC | PRN

## 2018-11-19 ENCOUNTER — Encounter: Payer: Self-pay | Admitting: Internal Medicine

## 2018-11-20 ENCOUNTER — Encounter: Payer: Self-pay | Admitting: Internal Medicine

## 2018-11-20 ENCOUNTER — Ambulatory Visit (INDEPENDENT_AMBULATORY_CARE_PROVIDER_SITE_OTHER): Payer: BLUE CROSS/BLUE SHIELD | Admitting: Internal Medicine

## 2018-11-20 DIAGNOSIS — I10 Essential (primary) hypertension: Secondary | ICD-10-CM

## 2018-11-20 DIAGNOSIS — R42 Dizziness and giddiness: Secondary | ICD-10-CM | POA: Diagnosis not present

## 2018-11-20 DIAGNOSIS — R202 Paresthesia of skin: Secondary | ICD-10-CM

## 2018-11-20 DIAGNOSIS — F419 Anxiety disorder, unspecified: Secondary | ICD-10-CM

## 2018-11-20 MED ORDER — ESCITALOPRAM OXALATE 10 MG PO TABS: ORAL_TABLET | ORAL | 2 refills | Status: DC

## 2018-11-20 NOTE — Patient Instructions (Signed)

## 2018-11-20 NOTE — Progress Notes (Signed)
Virtual Visit via Video Note  I connected with Chris Banks on 11/20/18 at  2:30 PM EDT by a video enabled telemedicine application and verified that I am speaking with the correct person using two identifiers.   I discussed the limitations of evaluation and management by telemedicine and the availability of in person appointments. The patient expressed understanding and agreed to proceed.  Pt Location: Home  Provider Location: Office  History of Present Illness:  Pt reports intermittent dizziness and tingling in his extremities. He notices this mostly when he is sitting down. He reports this only occurred x 1 yesterday. He describes the dizziness as a sense of imbalance. He denies associated headache, visual changes, sweating, nausea, chest pain or shortness of breath. Eating and laying down for 15 minutes improved this. He has been monitoring his blood pressure when this happens and it normally runs 106/90-138/90. He reports yesterday, during this episode, it dropped to 108/90. He is taking HCTZ as prescribed. He only consumes about 32 ounces of water daily. He is having issues with anxiety and wondering if this could be causing his symptoms. He has been seeing a psychotherapist at work and is waiting for a call about my referral to a psychotherapist. He is considering medication therapy at this time. He reports his wife and family are very supportive. He denies depression, SI/HI.  Observations/Objective:  Alert and oriented x 3 NAD He is mildly anxious appearing Judgement and thought content are normal.  Assessment and Plan:  Dizziness, Paresthesia, Anxiety, HTN:  Sounds like anxiety, less likely blood pressure, low blood sugar or dehydration Support offered today Will have referral coordinator reach out to him to set up his appt for psychotherapy Discussed CBT techniques such as distraction, deep breathing and meditation Discussed benefits of medication therapy, common side  effects Will trial Lexapro, RX sent to pharmacy Encouraged minimum 48 ounces of water intake daily Encouraged eating at regular intervals  Follow up in 1 month via [email or web address]  Follow Up Instructions:    I discussed the assessment and treatment plan with the patient. The patient was provided an opportunity to ask questions and all were answered. The patient agreed with the plan and demonstrated an understanding of the instructions.   The patient was advised to call back or seek an in-person evaluation if the symptoms worsen or if the condition fails to improve as anticipated.     Nicki Reaper, NP

## 2018-12-05 ENCOUNTER — Encounter: Payer: Self-pay | Admitting: Internal Medicine

## 2018-12-18 ENCOUNTER — Encounter: Payer: Self-pay | Admitting: Internal Medicine

## 2018-12-21 ENCOUNTER — Ambulatory Visit (INDEPENDENT_AMBULATORY_CARE_PROVIDER_SITE_OTHER): Payer: BLUE CROSS/BLUE SHIELD | Admitting: Internal Medicine

## 2018-12-21 DIAGNOSIS — F411 Generalized anxiety disorder: Secondary | ICD-10-CM

## 2018-12-23 ENCOUNTER — Encounter: Payer: Self-pay | Admitting: Internal Medicine

## 2018-12-23 DIAGNOSIS — F411 Generalized anxiety disorder: Secondary | ICD-10-CM | POA: Insufficient documentation

## 2018-12-23 NOTE — Assessment & Plan Note (Signed)
Improved with Lexapro, will continue Support offered today Will monitor

## 2018-12-23 NOTE — Patient Instructions (Signed)

## 2018-12-23 NOTE — Progress Notes (Signed)
Virtual Visit via Video Note  I connected with Chris Banks on 12/23/18 at  2:30 PM EDT by a video enabled telemedicine application and verified that I am speaking with the correct person using two identifiers.  Location: Patient: Home Provider: Home   I discussed the limitations of evaluation and management by telemedicine and the availability of in person appointments. The patient expressed understanding and agreed to proceed.  History of Present Illness:  Pt due for 1 month follow up of anxiety. At his last visit, he was started on Lexapro. He feels like his anxiety is much better now. He had some side effects in the beginning, but he reports these have gone away. The dizziness and tingling in his extremities he was feeling before is completely resolved. He is not currently seeing a therapist. He denies depression, SI/HI.    Past Medical History:  Diagnosis Date  . Allergy   . Asthma    childhood    Current Outpatient Medications  Medication Sig Dispense Refill  . atorvastatin (LIPITOR) 10 MG tablet Take 1 tablet (10 mg total) by mouth daily. 90 tablet 0  . escitalopram (LEXAPRO) 10 MG tablet Take 0.5 tablets (5 mg total) by mouth daily for 10 days, THEN 1 tablet (10 mg total) daily. 30 tablet 2  . hydrochlorothiazide (HYDRODIURIL) 25 MG tablet Take 1 tablet (25 mg total) by mouth daily. 90 tablet 0  . omeprazole (PRILOSEC) 20 MG capsule Take 1 capsule (20 mg total) by mouth daily. (Patient taking differently: Take 20 mg by mouth 2 (two) times daily before a meal. ) 90 capsule 0  . ondansetron (ZOFRAN) 4 MG tablet Take 1 tablet (4 mg total) by mouth every 8 (eight) hours as needed. 20 tablet 0   No current facility-administered medications for this visit.     No Known Allergies  Family History  Problem Relation Age of Onset  . Stroke Maternal Grandfather   . Hypertension Paternal Grandfather   . Cancer Neg Hx     Social History   Socioeconomic History  . Marital  status: Single    Spouse name: Not on file  . Number of children: Not on file  . Years of education: Not on file  . Highest education level: Not on file  Occupational History  . Not on file  Social Needs  . Financial resource strain: Not on file  . Food insecurity:    Worry: Not on file    Inability: Not on file  . Transportation needs:    Medical: Not on file    Non-medical: Not on file  Tobacco Use  . Smoking status: Never Smoker  . Smokeless tobacco: Never Used  Substance and Sexual Activity  . Alcohol use: Yes    Alcohol/week: 0.0 standard drinks    Comment: occasional  . Drug use: No  . Sexual activity: Yes  Lifestyle  . Physical activity:    Days per week: Not on file    Minutes per session: Not on file  . Stress: Not on file  Relationships  . Social connections:    Talks on phone: Not on file    Gets together: Not on file    Attends religious service: Not on file    Active member of club or organization: Not on file    Attends meetings of clubs or organizations: Not on file    Relationship status: Not on file  . Intimate partner violence:    Fear of current or ex  partner: Not on file    Emotionally abused: Not on file    Physically abused: Not on file    Forced sexual activity: Not on file  Other Topics Concern  . Not on file  Social History Narrative  . Not on file     Constitutional: Denies fever, malaise, fatigue, headache or abrupt weight changes.  Respiratory: Denies difficulty breathing, shortness of breath, cough or sputum production.   Cardiovascular: Denies chest pain, chest tightness, palpitations or swelling in the hands or feet.  Neurological: Denies dizziness, difficulty with memory, difficulty with speech or problems with balance and coordination.  Psych: Pt reports anxiety. Denies depression, SI/HI.  No other specific complaints in a complete review of systems (except as listed in HPI above).   Wt Readings from Last 3 Encounters:   10/30/18 (!) 327 lb (148.3 kg)  09/10/18 (!) 335 lb (152 kg)  10/24/17 (!) 332 lb 4 oz (150.7 kg)    General: Appears his stated age, obese in NAD. Pulmonary/Chest: Normal effort.. No respiratory distress.  Neurological: Alert and oriented. He appears distracted. Psychiatric: Mood and affect normal. Behavior is normal. Judgment and thought content normal.     BMET    Component Value Date/Time   NA 136 10/30/2018 1634   K 3.2 (L) 10/30/2018 1634   CL 102 10/30/2018 1634   CO2 20 10/30/2018 1634   GLUCOSE 89 10/30/2018 1634   BUN 13 10/30/2018 1634   CREATININE 1.20 10/30/2018 1634   CALCIUM 9.9 10/30/2018 1634    Lipid Panel     Component Value Date/Time   CHOL 139 02/07/2018 0859   TRIG 102.0 02/07/2018 0859   HDL 36.20 (L) 02/07/2018 0859   CHOLHDL 4 02/07/2018 0859   VLDL 20.4 02/07/2018 0859   LDLCALC 82 02/07/2018 0859    CBC    Component Value Date/Time   WBC 10.3 10/30/2018 1634   RBC 5.54 10/30/2018 1634   HGB 16.9 10/30/2018 1634   HCT 49.0 10/30/2018 1634   PLT 258.0 10/30/2018 1634   MCV 88.5 10/30/2018 1634   MCHC 34.6 10/30/2018 1634   RDW 13.2 10/30/2018 1634    Hgb A1C Lab Results  Component Value Date   HGBA1C 5.8 10/24/2017        Assessment and Plan:  See problem based charting  Follow Up Instructions:    I discussed the assessment and treatment plan with the patient. The patient was provided an opportunity to ask questions and all were answered. The patient agreed with the plan and demonstrated an understanding of the instructions.   The patient was advised to call back or seek an in-person evaluation if the symptoms worsen or if the condition fails to improve as anticipated.    Nicki Reaperegina , NP

## 2019-01-03 ENCOUNTER — Ambulatory Visit (INDEPENDENT_AMBULATORY_CARE_PROVIDER_SITE_OTHER): Payer: BLUE CROSS/BLUE SHIELD | Admitting: Psychology

## 2019-01-03 DIAGNOSIS — F411 Generalized anxiety disorder: Secondary | ICD-10-CM

## 2019-01-07 ENCOUNTER — Other Ambulatory Visit: Payer: Self-pay | Admitting: Internal Medicine

## 2019-01-07 DIAGNOSIS — K219 Gastro-esophageal reflux disease without esophagitis: Secondary | ICD-10-CM

## 2019-01-07 DIAGNOSIS — I10 Essential (primary) hypertension: Secondary | ICD-10-CM

## 2019-01-08 MED ORDER — OMEPRAZOLE 20 MG PO CPDR: 20.0000 mg | DELAYED_RELEASE_CAPSULE | Freq: Every day | ORAL | 0 refills | Status: AC

## 2019-01-08 MED ORDER — HYDROCHLOROTHIAZIDE 25 MG PO TABS: 25.0000 mg | ORAL_TABLET | Freq: Every day | ORAL | 0 refills | Status: DC

## 2019-01-08 MED ORDER — ATORVASTATIN CALCIUM 10 MG PO TABS: 10.0000 mg | ORAL_TABLET | Freq: Every day | ORAL | 0 refills | Status: DC

## 2019-01-15 ENCOUNTER — Encounter: Payer: BLUE CROSS/BLUE SHIELD | Admitting: Internal Medicine

## 2019-01-18 ENCOUNTER — Ambulatory Visit (INDEPENDENT_AMBULATORY_CARE_PROVIDER_SITE_OTHER): Payer: BC Managed Care – PPO | Admitting: Psychology

## 2019-01-18 DIAGNOSIS — F411 Generalized anxiety disorder: Secondary | ICD-10-CM

## 2019-01-29 ENCOUNTER — Ambulatory Visit (INDEPENDENT_AMBULATORY_CARE_PROVIDER_SITE_OTHER): Payer: BC Managed Care – PPO | Admitting: Psychology

## 2019-01-29 DIAGNOSIS — F411 Generalized anxiety disorder: Secondary | ICD-10-CM

## 2019-02-13 ENCOUNTER — Ambulatory Visit (INDEPENDENT_AMBULATORY_CARE_PROVIDER_SITE_OTHER): Payer: BC Managed Care – PPO | Admitting: Psychology

## 2019-02-13 DIAGNOSIS — F411 Generalized anxiety disorder: Secondary | ICD-10-CM

## 2019-02-19 ENCOUNTER — Encounter: Payer: Self-pay | Admitting: Internal Medicine

## 2019-02-20 MED ORDER — ESCITALOPRAM OXALATE 10 MG PO TABS: 10.0000 mg | ORAL_TABLET | Freq: Every day | ORAL | 1 refills | Status: DC

## 2019-02-20 NOTE — Telephone Encounter (Signed)
Pt is now taking 1 tablet of Lexapro daily - requesting refill.   Last filled 11/20/18 Last OV 12/21/18

## 2019-02-20 NOTE — Addendum Note (Signed)
Addended by: Jearld Fenton on: 02/20/2019 03:27 PM   Modules accepted: Orders

## 2019-02-26 ENCOUNTER — Ambulatory Visit (INDEPENDENT_AMBULATORY_CARE_PROVIDER_SITE_OTHER): Payer: BC Managed Care – PPO | Admitting: Psychology

## 2019-02-26 DIAGNOSIS — F411 Generalized anxiety disorder: Secondary | ICD-10-CM

## 2019-03-12 ENCOUNTER — Encounter: Payer: Self-pay | Admitting: Internal Medicine

## 2019-03-12 ENCOUNTER — Ambulatory Visit (INDEPENDENT_AMBULATORY_CARE_PROVIDER_SITE_OTHER): Payer: Self-pay | Admitting: Internal Medicine

## 2019-03-12 ENCOUNTER — Other Ambulatory Visit: Payer: Self-pay

## 2019-03-12 VITALS — BP 142/84 | HR 79 | Temp 97.9°F | Ht 71.5 in | Wt 347.0 lb

## 2019-03-12 DIAGNOSIS — E785 Hyperlipidemia, unspecified: Secondary | ICD-10-CM | POA: Insufficient documentation

## 2019-03-12 DIAGNOSIS — I10 Essential (primary) hypertension: Secondary | ICD-10-CM

## 2019-03-12 DIAGNOSIS — E7800 Pure hypercholesterolemia, unspecified: Secondary | ICD-10-CM

## 2019-03-12 DIAGNOSIS — Z0000 Encounter for general adult medical examination without abnormal findings: Secondary | ICD-10-CM

## 2019-03-12 DIAGNOSIS — K219 Gastro-esophageal reflux disease without esophagitis: Secondary | ICD-10-CM

## 2019-03-12 DIAGNOSIS — F411 Generalized anxiety disorder: Secondary | ICD-10-CM

## 2019-03-12 MED ORDER — LOSARTAN POTASSIUM-HCTZ 50-12.5 MG PO TABS: 1.0000 | ORAL_TABLET | Freq: Every day | ORAL | 0 refills | Status: DC

## 2019-03-12 NOTE — Assessment & Plan Note (Signed)
Uncontrolled Start Losartan HCT in place of HCTZ CMET today Reinforced DASH diet and exercise for weight loss  RTC in 2 weeks for BP followup

## 2019-03-12 NOTE — Patient Instructions (Signed)

## 2019-03-12 NOTE — Assessment & Plan Note (Signed)
Controlled on Omeprazole CBC and CMET today Discussed avoiding foods that trigger your reflux Discussed how weight loss can help improve reflux

## 2019-03-12 NOTE — Progress Notes (Signed)
Subjective:    Patient ID: Chris Banks, male    DOB: 06-13-1986, 33 y.o.   MRN: [id number]  HPI  Patient presents today for his annual physical examination.  He is also due for follow up of chronic conditions.  HTN:  His BP today is 140/84.  He is taking his HCTZ as prescribed.  ECG from 2016 reviewed.  GERD:  Triggered by spicy food.  He is taking Omeprazole as needed with good relief. There is no upper GI on file.  HLD:  His last LDL was 82, 02/2018.  He is taking his Atorvastatin as prescribed.  He denies any myalgias on this medication.  He tries to consume a low fat diet.  Anxiety:  Started on Lexapro 02/21/2019.  He denies SI/HI.  He does not see a therapist.  Flu: 05/2015 Tetanus: 09/2016 Dentist: as needed  Diet: He does eat meat. He consumes fruits and veggies daily. He does eat fried foods. He drinks mostly water. Exercise: Running 10 miles per week  Review of Systems      Past Medical History:  Diagnosis Date  . Allergy   . Asthma    childhood    Current Outpatient Medications  Medication Sig Dispense Refill  . atorvastatin (LIPITOR) 10 MG tablet Take 1 tablet (10 mg total) by mouth daily. 90 tablet 0  . escitalopram (LEXAPRO) 10 MG tablet Take 1 tablet (10 mg total) by mouth daily for 10 days. 30 tablet 1  . hydrochlorothiazide (HYDRODIURIL) 25 MG tablet Take 1 tablet (25 mg total) by mouth daily. 90 tablet 0  . omeprazole (PRILOSEC) 20 MG capsule Take 1 capsule (20 mg total) by mouth daily. 90 capsule 0  . ondansetron (ZOFRAN) 4 MG tablet Take 1 tablet (4 mg total) by mouth every 8 (eight) hours as needed. 20 tablet 0   No current facility-administered medications for this visit.     No Known Allergies  Family History  Problem Relation Age of Onset  . Stroke Maternal Grandfather   . Hypertension Paternal Grandfather   . Cancer Neg Hx     Social History   Socioeconomic History  . Marital status: Single    Spouse name: Not on file  . Number of  children: Not on file  . Years of education: Not on file  . Highest education level: Not on file  Occupational History  . Not on file  Social Needs  . Financial resource strain: Not on file  . Food insecurity    Worry: Not on file    Inability: Not on file  . Transportation needs    Medical: Not on file    Non-medical: Not on file  Tobacco Use  . Smoking status: Never Smoker  . Smokeless tobacco: Never Used  Substance and Sexual Activity  . Alcohol use: Yes    Alcohol/week: 0.0 standard drinks    Comment: occasional  . Drug use: No  . Sexual activity: Yes  Lifestyle  . Physical activity    Days per week: Not on file    Minutes per session: Not on file  . Stress: Not on file  Relationships  . Social Herbalist on phone: Not on file    Gets together: Not on file    Attends religious service: Not on file    Active member of club or organization: Not on file    Attends meetings of clubs or organizations: Not on file    Relationship status: Not  on file  . Intimate partner violence    Fear of current or ex partner: Not on file    Emotionally abused: Not on file    Physically abused: Not on file    Forced sexual activity: Not on file  Other Topics Concern  . Not on file  Social History Narrative  . Not on file     Constitutional: Denies fever, malaise, fatigue, headache or abrupt weight changes.  HEENT: Denies eye pain, eye redness, ear pain, ringing in the ears, wax buildup, runny nose, nasal congestion, bloody nose, or sore throat. Respiratory: Denies difficulty breathing, shortness of breath, cough or sputum production.   Cardiovascular: Denies chest pain, chest tightness, palpitations or swelling in the hands or feet.  Gastrointestinal: Pt reports intermittent reflux. Denies abdominal pain, bloating, constipation, diarrhea or blood in the stool.  GU: Denies urgency, frequency, pain with urination, burning sensation, blood in urine, odor or discharge.  Musculoskeletal: Denies decrease in range of motion, difficulty with gait, muscle pain or joint pain and swelling.  Skin: Denies redness, rashes, lesions or ulcercations.  Neurological: Denies dizziness, difficulty with memory, difficulty with speech or problems with balance and coordination.  Psych: Pt has a history anxiety. Denies depression, SI/HI.  No other specific complaints in a complete review of systems (except as listed in HPI above).  Objective:   Physical Exam    Wt Readings from Last 3 Encounters:  10/30/18 (!) 327 lb (148.3 kg)  09/10/18 (!) 335 lb (152 kg)  10/24/17 (!) 332 lb 4 oz (150.7 kg)    General: Appears his stated age, obese,  in NAD. Skin: Warm, dry and intact. No rashes noted. HEENT: Head: normal shape and size; Eyes: sclera white, no icterus, conjunctiva pink, PERRLA and EOMs intact; Ears: Tm's gray and intact, normal light reflex;  Neck:  Neck supple, trachea midline. No masses, lumps or thyromegaly present.  Cardiovascular: Normal rate and rhythm. S1,S2 noted.  No murmur, rubs or gallops noted. No JVD or BLE edema.  Pulmonary/Chest: Normal effort and positive vesicular breath sounds. No respiratory distress. No wheezes, rales or ronchi noted.  Abdomen: Soft and nontender. Normal bowel sounds. No distention or masses noted. Liver, spleen and kidneys non palpable. Musculoskeletal: Strength 5/5 BUE/BLE. No difficulty with gait.  Neurological: Alert and oriented. Cranial nerves II-XII grossly intact. Coordination normal.  Psychiatric: Mood and affect normal. Behavior is normal. Judgment and thought content normal.    BMET    Component Value Date/Time   NA 136 10/30/2018 1634   K 3.2 (L) 10/30/2018 1634   CL 102 10/30/2018 1634   CO2 20 10/30/2018 1634   GLUCOSE 89 10/30/2018 1634   BUN 13 10/30/2018 1634   CREATININE 1.20 10/30/2018 1634   CALCIUM 9.9 10/30/2018 1634    Lipid Panel     Component Value Date/Time   CHOL 139 02/07/2018 0859   TRIG  102.0 02/07/2018 0859   HDL 36.20 (L) 02/07/2018 0859   CHOLHDL 4 02/07/2018 0859   VLDL 20.4 02/07/2018 0859   LDLCALC 82 02/07/2018 0859    CBC    Component Value Date/Time   WBC 10.3 10/30/2018 1634   RBC 5.54 10/30/2018 1634   HGB 16.9 10/30/2018 1634   HCT 49.0 10/30/2018 1634   PLT 258.0 10/30/2018 1634   MCV 88.5 10/30/2018 1634   MCHC 34.6 10/30/2018 1634   RDW 13.2 10/30/2018 1634    Hgb A1C Lab Results  Component Value Date   HGBA1C 5.8 10/24/2017  Assessment & Plan:    Preventative Health Maintenance:  Encouraged him to get a flu shot in the fall Tetanus UTD Encouraged him to consume a balanced diet and exercise regimen Advised him to see a dentist annually Will check CBC, CMET, Lipid and A1C  RTC in 1 year, sooner if needed Nicki Reaperegina Christianjames Soule, NP

## 2019-03-12 NOTE — Assessment & Plan Note (Signed)
Controlled on Lexapro Support offered today Will monitor

## 2019-03-12 NOTE — Assessment & Plan Note (Signed)
CMET and lipid profile today Encouraged him to consume a low fat diet Continue Atorvastatin for now 

## 2019-03-13 ENCOUNTER — Ambulatory Visit (INDEPENDENT_AMBULATORY_CARE_PROVIDER_SITE_OTHER): Payer: Self-pay | Admitting: Psychology

## 2019-03-13 DIAGNOSIS — F411 Generalized anxiety disorder: Secondary | ICD-10-CM

## 2019-03-13 LAB — COMPREHENSIVE METABOLIC PANEL
ALT: 27 U/L (ref 0–53)
AST: 23 U/L (ref 0–37)
Albumin: 4.2 g/dL (ref 3.5–5.2)
Alkaline Phosphatase: 60 U/L (ref 39–117)
BUN: 15 mg/dL (ref 6–23)
CO2: 27 mEq/L (ref 19–32)
Calcium: 9.4 mg/dL (ref 8.4–10.5)
Chloride: 103 mEq/L (ref 96–112)
Creatinine, Ser: 0.94 mg/dL (ref 0.40–1.50)
GFR: 111.13 mL/min (ref >60.00)
Glucose, Bld: 87 mg/dL (ref 70–99)
Potassium: 4 mEq/L (ref 3.5–5.1)
Sodium: 139 mEq/L (ref 135–145)
Total Bilirubin: 0.3 mg/dL (ref 0.2–1.2)
Total Protein: 7.6 g/dL (ref 6.0–8.3)

## 2019-03-13 LAB — HEMOGLOBIN A1C: Hgb A1c MFr Bld: 5.8 % (ref 4.6–6.5)

## 2019-03-13 LAB — LIPID PANEL
Cholesterol: 148 mg/dL (ref 0–200)
HDL: 35.3 mg/dL — ABNORMAL LOW (ref >39.00)
LDL Cholesterol: 87 mg/dL (ref 0–99)
NonHDL: 112.53
Total CHOL/HDL Ratio: 4
Triglycerides: 126 mg/dL (ref 0.0–149.0)
VLDL: 25.2 mg/dL (ref 0.0–40.0)

## 2019-03-13 LAB — CBC
HCT: 45.6 % (ref 39.0–52.0)
Hemoglobin: 15.2 g/dL (ref 13.0–17.0)
MCHC: 33.2 g/dL (ref 30.0–36.0)
MCV: 90 fl (ref 78.0–100.0)
Platelets: 281 10*3/uL (ref 150.0–400.0)
RBC: 5.07 Mil/uL (ref 4.22–5.81)
RDW: 13.7 % (ref 11.5–15.5)
WBC: 11.4 10*3/uL — ABNORMAL HIGH (ref 4.0–10.5)

## 2019-03-26 ENCOUNTER — Ambulatory Visit: Payer: Self-pay | Admitting: Internal Medicine

## 2019-03-26 DIAGNOSIS — Z0289 Encounter for other administrative examinations: Secondary | ICD-10-CM

## 2019-04-04 ENCOUNTER — Ambulatory Visit (INDEPENDENT_AMBULATORY_CARE_PROVIDER_SITE_OTHER): Payer: Self-pay | Admitting: Psychology

## 2019-04-04 DIAGNOSIS — F411 Generalized anxiety disorder: Secondary | ICD-10-CM

## 2019-04-18 ENCOUNTER — Ambulatory Visit (INDEPENDENT_AMBULATORY_CARE_PROVIDER_SITE_OTHER): Payer: BC Managed Care – PPO | Admitting: Psychology

## 2019-04-18 DIAGNOSIS — F411 Generalized anxiety disorder: Secondary | ICD-10-CM

## 2019-04-22 ENCOUNTER — Other Ambulatory Visit: Payer: Self-pay | Admitting: Internal Medicine

## 2019-04-22 MED ORDER — ESCITALOPRAM OXALATE 10 MG PO TABS: 10.0000 mg | ORAL_TABLET | Freq: Every day | ORAL | 0 refills | Status: DC

## 2019-04-22 MED ORDER — LOSARTAN POTASSIUM-HCTZ 50-12.5 MG PO TABS: 1.0000 | ORAL_TABLET | Freq: Every day | ORAL | 0 refills | Status: DC

## 2019-04-25 ENCOUNTER — Ambulatory Visit: Payer: Self-pay | Admitting: Psychology

## 2019-05-09 ENCOUNTER — Ambulatory Visit: Payer: Self-pay | Admitting: Psychology

## 2019-05-16 ENCOUNTER — Ambulatory Visit (INDEPENDENT_AMBULATORY_CARE_PROVIDER_SITE_OTHER): Payer: BC Managed Care – PPO | Admitting: Psychology

## 2019-05-16 DIAGNOSIS — F411 Generalized anxiety disorder: Secondary | ICD-10-CM

## 2019-05-23 ENCOUNTER — Encounter: Payer: Self-pay | Admitting: Internal Medicine

## 2019-05-23 ENCOUNTER — Other Ambulatory Visit: Payer: Self-pay

## 2019-05-23 MED ORDER — ESCITALOPRAM OXALATE 10 MG PO TABS: 10.0000 mg | ORAL_TABLET | Freq: Every day | ORAL | 2 refills | Status: DC

## 2019-05-23 MED ORDER — ATORVASTATIN CALCIUM 10 MG PO TABS: 10.0000 mg | ORAL_TABLET | Freq: Every day | ORAL | 2 refills | Status: DC

## 2019-05-23 MED ORDER — LOSARTAN POTASSIUM-HCTZ 50-12.5 MG PO TABS: 1.0000 | ORAL_TABLET | Freq: Every day | ORAL | 0 refills | Status: DC

## 2019-05-30 ENCOUNTER — Ambulatory Visit (INDEPENDENT_AMBULATORY_CARE_PROVIDER_SITE_OTHER): Payer: BC Managed Care – PPO | Admitting: Psychology

## 2019-05-30 DIAGNOSIS — F411 Generalized anxiety disorder: Secondary | ICD-10-CM

## 2019-06-13 ENCOUNTER — Ambulatory Visit (INDEPENDENT_AMBULATORY_CARE_PROVIDER_SITE_OTHER): Payer: BC Managed Care – PPO | Admitting: Psychology

## 2019-06-13 DIAGNOSIS — F411 Generalized anxiety disorder: Secondary | ICD-10-CM

## 2019-06-21 ENCOUNTER — Other Ambulatory Visit: Payer: Self-pay

## 2019-06-21 ENCOUNTER — Ambulatory Visit (INDEPENDENT_AMBULATORY_CARE_PROVIDER_SITE_OTHER): Payer: BC Managed Care – PPO | Admitting: Internal Medicine

## 2019-06-21 VITALS — BP 122/84 | HR 80 | Temp 98.0°F | Wt 366.0 lb

## 2019-06-21 DIAGNOSIS — I10 Essential (primary) hypertension: Secondary | ICD-10-CM | POA: Diagnosis not present

## 2019-06-21 DIAGNOSIS — Z23 Encounter for immunization: Secondary | ICD-10-CM | POA: Diagnosis not present

## 2019-06-21 MED ORDER — LOSARTAN POTASSIUM-HCTZ 50-12.5 MG PO TABS: 1.0000 | ORAL_TABLET | Freq: Every day | ORAL | 1 refills | Status: DC

## 2019-06-21 NOTE — Progress Notes (Signed)
Subjective:    Patient ID: Chris Banks, male    DOB: November 20, 1985, 33 y.o.   MRN: [id number]  HPI  Pt presents to the clinic today for follow up HTN. His BP today is 122/84 which is improved from previous BP of 140/84 on 03/2019. He is taking Losartan HCT 50-12.5 as prescribed. He reports no issues with current medication. He mentions he has been urinating a little more but has been drinking plenty of water. Patient denies headaches, dizziness, or vision problems. Last A1C was 5.8 on 03/2019.    Review of Systems      Past Medical History:  Diagnosis Date  . Allergy   . Asthma    childhood    Current Outpatient Medications  Medication Sig Dispense Refill  . atorvastatin (LIPITOR) 10 MG tablet Take 1 tablet (10 mg total) by mouth daily. 90 tablet 2  . escitalopram (LEXAPRO) 10 MG tablet Take 1 tablet (10 mg total) by mouth daily. 90 tablet 2  . losartan-hydrochlorothiazide (HYZAAR) 50-12.5 MG tablet Take 1 tablet by mouth daily. 30 tablet 0  . omeprazole (PRILOSEC) 20 MG capsule Take 1 capsule (20 mg total) by mouth daily. 90 capsule 0  . ondansetron (ZOFRAN) 4 MG tablet Take 1 tablet (4 mg total) by mouth every 8 (eight) hours as needed. 20 tablet 0   No current facility-administered medications for this visit.     No Known Allergies  Family History  Problem Relation Age of Onset  . Stroke Maternal Grandfather   . Hypertension Paternal Grandfather   . Cancer Neg Hx     Social History   Socioeconomic History  . Marital status: Single    Spouse name: Not on file  . Number of children: Not on file  . Years of education: Not on file  . Highest education level: Not on file  Occupational History  . Not on file  Social Needs  . Financial resource strain: Not on file  . Food insecurity    Worry: Not on file    Inability: Not on file  . Transportation needs    Medical: Not on file    Non-medical: Not on file  Tobacco Use  . Smoking status: Never Smoker  . Smokeless  tobacco: Never Used  Substance and Sexual Activity  . Alcohol use: Yes    Alcohol/week: 0.0 standard drinks    Comment: occasional  . Drug use: No  . Sexual activity: Yes  Lifestyle  . Physical activity    Days per week: Not on file    Minutes per session: Not on file  . Stress: Not on file  Relationships  . Social Herbalist on phone: Not on file    Gets together: Not on file    Attends religious service: Not on file    Active member of club or organization: Not on file    Attends meetings of clubs or organizations: Not on file    Relationship status: Not on file  . Intimate partner violence    Fear of current or ex partner: Not on file    Emotionally abused: Not on file    Physically abused: Not on file    Forced sexual activity: Not on file  Other Topics Concern  . Not on file  Social History Narrative  . Not on file     Constitutional: Denies fever, malaise, fatigue, headache or abrupt weight changes.  Respiratory: Denies difficulty breathing, shortness of breath, cough or  sputum production.   Cardiovascular: Denies chest pain, chest tightness, palpitations or swelling in the hands or feet.   No other specific complaints in a complete review of systems (except as listed in HPI above).   Objective:   Physical Exam  BP 122/84   Pulse 80   Temp 98 F (36.7 C) (Temporal)   Wt (!) 366 lb (166 kg)   SpO2 98%   BMI 50.34 kg/m   Wt Readings from Last 3 Encounters:  03/12/19 (!) 347 lb (157.4 kg)  10/30/18 (!) 327 lb (148.3 kg)  09/10/18 (!) 335 lb (152 kg)    General: Appears his stated age, obese in NAD Cardiovascular: Normal rate and rhythm. S1,S2 noted.  No murmur, rubs or gallops noted. No JVD or BLE edema. Pulmonary/Chest: Normal effort and positive vesicular breath sounds. No respiratory distress. No wheezes, rales or ronchi noted.    BMET    Component Value Date/Time   NA 139 03/12/2019 1543   K 4.0 03/12/2019 1543   CL 103 03/12/2019  1543   CO2 27 03/12/2019 1543   GLUCOSE 87 03/12/2019 1543   BUN 15 03/12/2019 1543   CREATININE 0.94 03/12/2019 1543   CALCIUM 9.4 03/12/2019 1543    Lipid Panel     Component Value Date/Time   CHOL 148 03/12/2019 1543   TRIG 126.0 03/12/2019 1543   HDL 35.30 (L) 03/12/2019 1543   CHOLHDL 4 03/12/2019 1543   VLDL 25.2 03/12/2019 1543   LDLCALC 87 03/12/2019 1543    CBC    Component Value Date/Time   WBC 11.4 (H) 03/12/2019 1543   RBC 5.07 03/12/2019 1543   HGB 15.2 03/12/2019 1543   HCT 45.6 03/12/2019 1543   PLT 281.0 03/12/2019 1543   MCV 90.0 03/12/2019 1543   MCHC 33.2 03/12/2019 1543   RDW 13.7 03/12/2019 1543    Hgb A1C Lab Results  Component Value Date   HGBA1C 5.8 03/12/2019            Assessment & Plan:  Essential HTN:  Continue Losartan HCTZ as prescribed Encouraged to continue eating a healthy, balanced and low-salt diet Encouraged to continue with exercise for weight loss Will check BMET  Return precautions discussed Nicki Reaper, NP

## 2019-06-22 LAB — BASIC METABOLIC PANEL
BUN: 14 mg/dL (ref 7–25)
CO2: 25 mmol/L (ref 20–32)
Calcium: 9.3 mg/dL (ref 8.6–10.3)
Chloride: 102 mmol/L (ref 98–110)
Creat: 0.88 mg/dL (ref 0.60–1.35)
Glucose, Bld: 88 mg/dL (ref 65–99)
Potassium: 4.1 mmol/L (ref 3.5–5.3)
Sodium: 138 mmol/L (ref 135–146)

## 2019-06-23 ENCOUNTER — Encounter: Payer: Self-pay | Admitting: Internal Medicine

## 2019-06-23 NOTE — Patient Instructions (Signed)

## 2019-06-23 NOTE — Assessment & Plan Note (Signed)
Controlled on Losartan HCTZ, continue as prescribed Encouraged to eat a healthy, balanced and low-salt diet Encouraged to continue exercise for weigh loss

## 2019-06-27 ENCOUNTER — Ambulatory Visit (INDEPENDENT_AMBULATORY_CARE_PROVIDER_SITE_OTHER): Payer: BC Managed Care – PPO | Admitting: Psychology

## 2019-06-27 DIAGNOSIS — F411 Generalized anxiety disorder: Secondary | ICD-10-CM | POA: Diagnosis not present

## 2019-07-18 ENCOUNTER — Ambulatory Visit (INDEPENDENT_AMBULATORY_CARE_PROVIDER_SITE_OTHER): Payer: BC Managed Care – PPO | Admitting: Psychology

## 2019-07-18 DIAGNOSIS — F411 Generalized anxiety disorder: Secondary | ICD-10-CM

## 2019-07-29 ENCOUNTER — Ambulatory Visit (INDEPENDENT_AMBULATORY_CARE_PROVIDER_SITE_OTHER): Payer: BC Managed Care – PPO | Admitting: Psychology

## 2019-07-29 DIAGNOSIS — F411 Generalized anxiety disorder: Secondary | ICD-10-CM | POA: Diagnosis not present

## 2019-08-15 ENCOUNTER — Ambulatory Visit (INDEPENDENT_AMBULATORY_CARE_PROVIDER_SITE_OTHER): Payer: BC Managed Care – PPO | Admitting: Psychology

## 2019-08-15 DIAGNOSIS — F411 Generalized anxiety disorder: Secondary | ICD-10-CM | POA: Diagnosis not present

## 2019-08-24 DIAGNOSIS — S99922A Unspecified injury of left foot, initial encounter: Secondary | ICD-10-CM | POA: Diagnosis not present

## 2019-08-24 DIAGNOSIS — S92425A Nondisplaced fracture of distal phalanx of left great toe, initial encounter for closed fracture: Secondary | ICD-10-CM | POA: Diagnosis not present

## 2019-08-29 ENCOUNTER — Ambulatory Visit: Payer: BC Managed Care – PPO | Admitting: Psychology

## 2019-08-29 DIAGNOSIS — S92412D Displaced fracture of proximal phalanx of left great toe, subsequent encounter for fracture with routine healing: Secondary | ICD-10-CM | POA: Diagnosis not present

## 2019-09-12 ENCOUNTER — Ambulatory Visit (INDEPENDENT_AMBULATORY_CARE_PROVIDER_SITE_OTHER): Payer: BC Managed Care – PPO | Admitting: Psychology

## 2019-09-12 DIAGNOSIS — F411 Generalized anxiety disorder: Secondary | ICD-10-CM

## 2019-09-26 ENCOUNTER — Ambulatory Visit (INDEPENDENT_AMBULATORY_CARE_PROVIDER_SITE_OTHER): Payer: BC Managed Care – PPO | Admitting: Psychology

## 2019-09-26 DIAGNOSIS — F411 Generalized anxiety disorder: Secondary | ICD-10-CM

## 2019-10-03 DIAGNOSIS — S92412D Displaced fracture of proximal phalanx of left great toe, subsequent encounter for fracture with routine healing: Secondary | ICD-10-CM | POA: Diagnosis not present

## 2019-10-10 ENCOUNTER — Ambulatory Visit (INDEPENDENT_AMBULATORY_CARE_PROVIDER_SITE_OTHER): Payer: BC Managed Care – PPO | Admitting: Psychology

## 2019-10-10 DIAGNOSIS — F411 Generalized anxiety disorder: Secondary | ICD-10-CM

## 2019-10-25 ENCOUNTER — Ambulatory Visit: Payer: BC Managed Care – PPO | Attending: Internal Medicine

## 2019-10-25 DIAGNOSIS — Z23 Encounter for immunization: Secondary | ICD-10-CM

## 2019-10-25 NOTE — Progress Notes (Signed)
   Covid-19 Vaccination Clinic  Name:  Chris Banks    MRN: [id number] DOB: Jun 02, 1986  10/25/2019  [name] was observed post Covid-19 immunization for 15 minutes without incident. He was provided with Vaccine Information Sheet and instruction to access the V-Safe system.   [name] was instructed to call 911 with any severe reactions post vaccine: [name] Kitchen Difficulty breathing  . Swelling of face and throat  . A fast heartbeat  . A bad rash all over body  . Dizziness and weakness   Immunizations Administered    Name Date Dose VIS Date Route   Pfizer COVID-19 Vaccine 10/25/2019 11:45 AM 0.3 mL 07/19/2019 Intramuscular   Manufacturer: [employer], [employer]   Lot: [id number]   NDC: [address]-2

## 2019-10-31 ENCOUNTER — Ambulatory Visit (INDEPENDENT_AMBULATORY_CARE_PROVIDER_SITE_OTHER): Payer: BC Managed Care – PPO | Admitting: Psychology

## 2019-10-31 DIAGNOSIS — F411 Generalized anxiety disorder: Secondary | ICD-10-CM

## 2019-11-07 ENCOUNTER — Ambulatory Visit: Payer: BC Managed Care – PPO | Admitting: Psychology

## 2019-11-14 ENCOUNTER — Ambulatory Visit (INDEPENDENT_AMBULATORY_CARE_PROVIDER_SITE_OTHER): Payer: BC Managed Care – PPO | Admitting: Psychology

## 2019-11-14 DIAGNOSIS — F411 Generalized anxiety disorder: Secondary | ICD-10-CM

## 2019-11-19 ENCOUNTER — Ambulatory Visit: Payer: BC Managed Care – PPO | Attending: Internal Medicine

## 2019-11-19 DIAGNOSIS — Z23 Encounter for immunization: Secondary | ICD-10-CM

## 2019-11-19 NOTE — Progress Notes (Signed)
   Covid-19 Vaccination Clinic  Name:  Chris Banks    MRN: [id number] DOB: 04/25/1986  11/19/2019  [name] was observed post Covid-19 immunization for 15 minutes without incident. He was provided with Vaccine Information Sheet and instruction to access the V-Safe system.   [name] was instructed to call 911 with any severe reactions post vaccine: [name] Kitchen Difficulty breathing  . Swelling of face and throat  . A fast heartbeat  . A bad rash all over body  . Dizziness and weakness   Immunizations Administered    Name Date Dose VIS Date Route   Pfizer COVID-19 Vaccine 11/19/2019 12:42 PM 0.3 mL 07/19/2019 Intramuscular   Manufacturer: [employer], [employer]   Lot: [id number]   NDC: [address]-8

## 2019-11-28 ENCOUNTER — Ambulatory Visit (INDEPENDENT_AMBULATORY_CARE_PROVIDER_SITE_OTHER): Payer: BC Managed Care – PPO | Admitting: Psychology

## 2019-11-28 DIAGNOSIS — F411 Generalized anxiety disorder: Secondary | ICD-10-CM

## 2019-12-12 ENCOUNTER — Ambulatory Visit: Payer: BC Managed Care – PPO | Admitting: Psychology

## 2019-12-17 ENCOUNTER — Encounter: Payer: Self-pay | Admitting: Internal Medicine

## 2019-12-18 MED ORDER — LOSARTAN POTASSIUM-HCTZ 50-12.5 MG PO TABS: 1.0000 | ORAL_TABLET | Freq: Every day | ORAL | 1 refills | Status: DC

## 2019-12-26 ENCOUNTER — Ambulatory Visit (INDEPENDENT_AMBULATORY_CARE_PROVIDER_SITE_OTHER): Payer: BC Managed Care – PPO | Admitting: Psychology

## 2019-12-26 DIAGNOSIS — F411 Generalized anxiety disorder: Secondary | ICD-10-CM

## 2020-01-10 ENCOUNTER — Ambulatory Visit (INDEPENDENT_AMBULATORY_CARE_PROVIDER_SITE_OTHER): Payer: BC Managed Care – PPO | Admitting: Psychology

## 2020-01-10 DIAGNOSIS — F411 Generalized anxiety disorder: Secondary | ICD-10-CM | POA: Diagnosis not present

## 2020-01-23 ENCOUNTER — Ambulatory Visit: Payer: BC Managed Care – PPO | Admitting: Psychology

## 2020-01-28 ENCOUNTER — Encounter: Payer: Self-pay | Admitting: Internal Medicine

## 2020-02-06 ENCOUNTER — Ambulatory Visit: Payer: BC Managed Care – PPO | Admitting: Psychology

## 2020-02-14 MED ORDER — ESCITALOPRAM OXALATE 10 MG PO TABS: 10.0000 mg | ORAL_TABLET | Freq: Every day | ORAL | 0 refills | Status: DC

## 2020-02-18 MED ORDER — ATORVASTATIN CALCIUM 10 MG PO TABS: 10.0000 mg | ORAL_TABLET | Freq: Every day | ORAL | 0 refills | Status: DC

## 2020-02-18 NOTE — Addendum Note (Signed)
Addended by: Littie Deeds Y on: 02/18/2020 08:00 AM   Modules accepted: Orders

## 2020-02-27 ENCOUNTER — Ambulatory Visit (INDEPENDENT_AMBULATORY_CARE_PROVIDER_SITE_OTHER): Payer: BC Managed Care – PPO | Admitting: Psychology

## 2020-02-27 DIAGNOSIS — F411 Generalized anxiety disorder: Secondary | ICD-10-CM

## 2020-03-12 ENCOUNTER — Encounter: Payer: Self-pay | Admitting: Internal Medicine

## 2020-03-12 ENCOUNTER — Ambulatory Visit (INDEPENDENT_AMBULATORY_CARE_PROVIDER_SITE_OTHER): Payer: BC Managed Care – PPO | Admitting: Psychology

## 2020-03-12 DIAGNOSIS — F411 Generalized anxiety disorder: Secondary | ICD-10-CM

## 2020-03-19 ENCOUNTER — Encounter: Payer: Self-pay | Admitting: Internal Medicine

## 2020-03-19 ENCOUNTER — Telehealth: Payer: Self-pay

## 2020-03-19 NOTE — Telephone Encounter (Signed)
Noted Ok to cancel appt

## 2020-03-19 NOTE — Telephone Encounter (Signed)
[facility] Night - Client Nonclinical Telephone Record AccessNurse Client [facility] Night - Client Client Site [facility] - Night Physician Nicki Reaper - NP Contact Type Call Who Is Calling Patient / Member / Family / Caregiver Caller Name Payne Garske Caller Phone Number [phone] Patient Name Chris Banks Patient DOB [birth year] Call Type Message Only Information Provided Reason for Call Request to [facility] Appointment Initial Comment Caller states he has to cancel his appointment for tomorrow due to a family emergency. Additional Comment Office hours given. Disp. Time Disposition Final User 03/18/2020 10:50:54 PM General Information Provided Yes Sabas Sous Call Closed By: Sabas Sous Transaction Date/Time: 03/18/2020 10:47:56 PM (ET)

## 2020-03-24 ENCOUNTER — Ambulatory Visit (INDEPENDENT_AMBULATORY_CARE_PROVIDER_SITE_OTHER): Payer: BC Managed Care – PPO | Admitting: Psychology

## 2020-03-24 DIAGNOSIS — F411 Generalized anxiety disorder: Secondary | ICD-10-CM

## 2020-04-07 ENCOUNTER — Ambulatory Visit (INDEPENDENT_AMBULATORY_CARE_PROVIDER_SITE_OTHER): Payer: BC Managed Care – PPO | Admitting: Psychology

## 2020-04-07 DIAGNOSIS — F411 Generalized anxiety disorder: Secondary | ICD-10-CM | POA: Diagnosis not present

## 2020-04-21 ENCOUNTER — Ambulatory Visit (INDEPENDENT_AMBULATORY_CARE_PROVIDER_SITE_OTHER): Payer: BC Managed Care – PPO | Admitting: Psychology

## 2020-04-21 DIAGNOSIS — F411 Generalized anxiety disorder: Secondary | ICD-10-CM | POA: Diagnosis not present

## 2020-05-07 ENCOUNTER — Ambulatory Visit (INDEPENDENT_AMBULATORY_CARE_PROVIDER_SITE_OTHER): Payer: BC Managed Care – PPO | Admitting: Psychology

## 2020-05-07 DIAGNOSIS — F411 Generalized anxiety disorder: Secondary | ICD-10-CM | POA: Diagnosis not present

## 2020-05-17 ENCOUNTER — Encounter: Payer: Self-pay | Admitting: Internal Medicine

## 2020-05-18 MED ORDER — ESCITALOPRAM OXALATE 10 MG PO TABS: 10.0000 mg | ORAL_TABLET | Freq: Every day | ORAL | 0 refills | Status: DC

## 2020-05-28 ENCOUNTER — Ambulatory Visit: Payer: BC Managed Care – PPO | Admitting: Psychology

## 2020-05-29 ENCOUNTER — Encounter: Payer: Self-pay | Admitting: Internal Medicine

## 2020-05-29 ENCOUNTER — Other Ambulatory Visit: Payer: Self-pay

## 2020-05-29 ENCOUNTER — Ambulatory Visit (INDEPENDENT_AMBULATORY_CARE_PROVIDER_SITE_OTHER): Payer: BC Managed Care – PPO | Admitting: Internal Medicine

## 2020-05-29 VITALS — BP 138/86 | HR 78 | Temp 97.4°F | Ht 71.5 in | Wt 365.0 lb

## 2020-05-29 DIAGNOSIS — I10 Essential (primary) hypertension: Secondary | ICD-10-CM

## 2020-05-29 DIAGNOSIS — Z0000 Encounter for general adult medical examination without abnormal findings: Secondary | ICD-10-CM | POA: Diagnosis not present

## 2020-05-29 DIAGNOSIS — E7800 Pure hypercholesterolemia, unspecified: Secondary | ICD-10-CM | POA: Diagnosis not present

## 2020-05-29 DIAGNOSIS — L600 Ingrowing nail: Secondary | ICD-10-CM | POA: Diagnosis not present

## 2020-05-29 DIAGNOSIS — F411 Generalized anxiety disorder: Secondary | ICD-10-CM | POA: Diagnosis not present

## 2020-05-29 DIAGNOSIS — Z23 Encounter for immunization: Secondary | ICD-10-CM

## 2020-05-29 DIAGNOSIS — L2082 Flexural eczema: Secondary | ICD-10-CM

## 2020-05-29 DIAGNOSIS — K219 Gastro-esophageal reflux disease without esophagitis: Secondary | ICD-10-CM

## 2020-05-29 DIAGNOSIS — Z0001 Encounter for general adult medical examination with abnormal findings: Secondary | ICD-10-CM | POA: Diagnosis not present

## 2020-05-29 MED ORDER — TRIAMCINOLONE ACETONIDE 0.1 % EX CREA: 1.0000 "application " | TOPICAL_CREAM | Freq: Two times a day (BID) | CUTANEOUS | 0 refills | Status: AC

## 2020-05-29 MED ORDER — ATORVASTATIN CALCIUM 10 MG PO TABS: 10.0000 mg | ORAL_TABLET | Freq: Every day | ORAL | 3 refills | Status: DC

## 2020-05-29 NOTE — Assessment & Plan Note (Signed)
Continue Escitalopram, wean not indicated Support offered

## 2020-05-29 NOTE — Assessment & Plan Note (Signed)
CMET and lipid profile today Encouraged him to consume a low fat diet Continue Atorvastatin, refilled today 

## 2020-05-29 NOTE — Patient Instructions (Signed)

## 2020-05-29 NOTE — Progress Notes (Signed)
Subjective:    Patient ID: Chris Banks, male    DOB: 03/17/1986, 34 y.o.   MRN: [id number]  HPI  Pt presents to the clinic today for his annual exam. He is also due to follow up chronic conditions.  GAD: Persistent. Managed on Escitalopram. He is not seeing a therapist. He denies depression, SI/HI.  GERD: Triggered by. He denies breakthrough, no longer on Omeprazole. There is no upper GI on file.  HTN: His BP today is 138/86. He is taking Losartan HCT as prescribed.ECG from 10/2014 reviewed.  HLD: His last LDL was 87, 03/2019. He denies myalgias on Atorvastatin. He tries to consume a low fat diet.  He reports ingrown toenail of his great toe. He would like to know how this is treated.  He also reports rash of his chest/upper abdomen. This itches at times. It has not spread. He has applied lotion with minimal relief of symptoms.  Flu: 06/2019 Tetanus: 09/2016 Covid: [employer] Dentist: biannually  Diet: He does eat meat. He consumes fruits and veggies daily. He is eating some fried foods but cutting back. He drinks mostly water. Exercise: HIIT, strength training 4 days per week  Review of Systems      Past Medical History:  Diagnosis Date  . Allergy   . Asthma    childhood    Current Outpatient Medications  Medication Sig Dispense Refill  . atorvastatin (LIPITOR) 10 MG tablet Take 1 tablet (10 mg total) by mouth daily. 90 tablet 0  . escitalopram (LEXAPRO) 10 MG tablet Take 1 tablet (10 mg total) by mouth daily. 90 tablet 0  . losartan-hydrochlorothiazide (HYZAAR) 50-12.5 MG tablet Take 1 tablet by mouth daily. 90 tablet 1  . omeprazole (PRILOSEC) 20 MG capsule Take 1 capsule (20 mg total) by mouth daily. 90 capsule 0  . ondansetron (ZOFRAN) 4 MG tablet Take 1 tablet (4 mg total) by mouth every 8 (eight) hours as needed. 20 tablet 0   No current facility-administered medications for this visit.    No Known Allergies  Family History  Problem Relation Age of Onset  .  Stroke Maternal Grandfather   . Hypertension Paternal Grandfather   . Cancer Neg Hx     Social History   Socioeconomic History  . Marital status: Single    Spouse name: Not on file  . Number of children: Not on file  . Years of education: Not on file  . Highest education level: Not on file  Occupational History  . Not on file  Tobacco Use  . Smoking status: Never Smoker  . Smokeless tobacco: Never Used  Substance and Sexual Activity  . Alcohol use: Yes    Alcohol/week: 0.0 standard drinks    Comment: occasional  . Drug use: No  . Sexual activity: Yes  Other Topics Concern  . Not on file  Social History Narrative  . Not on file   Social Determinants of Health   Financial Resource Strain:   . Difficulty of Paying Living Expenses: Not on file  Food Insecurity:   . Worried About Programme researcher, broadcasting/film/video in the Last Year: Not on file  . Ran Out of Food in the Last Year: Not on file  Transportation Needs:   . Lack of Transportation (Medical): Not on file  . Lack of Transportation (Non-Medical): Not on file  Physical Activity:   . Days of Exercise per Week: Not on file  . Minutes of Exercise per Session: Not on file  Stress:   .  Feeling of Stress : Not on file  Social Connections:   . Frequency of Communication with Friends and Family: Not on file  . Frequency of Social Gatherings with Friends and Family: Not on file  . Attends Religious Services: Not on file  . Active Member of Clubs or Organizations: Not on file  . Attends Banker Meetings: Not on file  . Marital Status: Not on file  Intimate Partner Violence:   . Fear of Current or Ex-Partner: Not on file  . Emotionally Abused: Not on file  . Physically Abused: Not on file  . Sexually Abused: Not on file     Constitutional: Denies fever, malaise, fatigue, headache or abrupt weight changes.  HEENT: Denies eye pain, eye redness, ear pain, ringing in the ears, wax buildup, runny nose, nasal congestion,  bloody nose, or sore throat. Respiratory: Denies difficulty breathing, shortness of breath, cough or sputum production.   Cardiovascular: Denies chest pain, chest tightness, palpitations or swelling in the hands or feet.  Gastrointestinal: Denies abdominal pain, bloating, constipation, diarrhea or blood in the stool.  GU: Denies urgency, frequency, pain with urination, burning sensation, blood in urine, odor or discharge. Musculoskeletal: Denies decrease in range of motion, difficulty with gait, muscle pain or joint pain and swelling.  Skin: Pt reports rash of chest, ingrown toenail of foot. Denies redness, lesions or ulcercations.  Neurological: Denies dizziness, difficulty with memory, difficulty with speech or problems with balance and coordination.  Psych: Pt has a history of anxiety. Denies depression, SI/HI.  No other specific complaints in a complete review of systems (except as listed in HPI above).  Objective:   Physical Exam  BP 138/86   Pulse 78   Temp (!) 97.4 F (36.3 C) (Temporal)   Ht 5' 11.5" (1.816 m)   Wt (!) 365 lb (165.6 kg)   SpO2 98%   BMI 50.20 kg/m   Wt Readings from Last 3 Encounters:  06/21/19 (!) 366 lb (166 kg)  03/12/19 (!) 347 lb (157.4 kg)  10/30/18 (!) 327 lb (148.3 kg)    General: Appears his stated age, obese in NAD. Skin: Warm, dry and intact. Eczema noted of upper abdomen, lower chest. HEENT: Head: normal shape and size; Eyes: sclera white, no icterus, conjunctiva pink, PERRLA and EOMs intact;  Neck:  Neck supple, trachea midline. No masses, lumps or thyromegaly present.  Cardiovascular: Normal rate and rhythm. S1,S2 noted.  No murmur, rubs or gallops noted. No JVD or BLE edema. Pulmonary/Chest: Normal effort and positive vesicular breath sounds. No respiratory distress. No wheezes, rales or ronchi noted.  Abdomen: Soft and nontender. Normal bowel sounds. No distention or masses noted. Liver, spleen and kidneys non palpable. Musculoskeletal:  Strength 5/5 BUE/BLE. No difficulty with gait.  Neurological: Alert and oriented. Cranial nerves II-XII grossly intact. Coordination normal.  Psychiatric: Mood and affect normal. Behavior is normal. Judgment and thought content normal.    BMET    Component Value Date/Time   NA 138 06/21/2019 1614   K 4.1 06/21/2019 1614   CL 102 06/21/2019 1614   CO2 25 06/21/2019 1614   GLUCOSE 88 06/21/2019 1614   BUN 14 06/21/2019 1614   CREATININE 0.88 06/21/2019 1614   CALCIUM 9.3 06/21/2019 1614    Lipid Panel     Component Value Date/Time   CHOL 148 03/12/2019 1543   TRIG 126.0 03/12/2019 1543   HDL 35.30 (L) 03/12/2019 1543   CHOLHDL 4 03/12/2019 1543   VLDL 25.2 03/12/2019 1543  LDLCALC 87 03/12/2019 1543    CBC    Component Value Date/Time   WBC 11.4 (H) 03/12/2019 1543   RBC 5.07 03/12/2019 1543   HGB 15.2 03/12/2019 1543   HCT 45.6 03/12/2019 1543   PLT 281.0 03/12/2019 1543   MCV 90.0 03/12/2019 1543   MCHC 33.2 03/12/2019 1543   RDW 13.7 03/12/2019 1543    Hgb A1C Lab Results  Component Value Date   HGBA1C 5.8 03/12/2019            Assessment & Plan:   Preventative Health Maintenance:  Flu shot today Tetanus UTD Covid UTD Encouraged him to consume a balanced diet and exercise regimen Advised him to see an eye doctor and dentist annually Will check CBC, CMET, TSH, Lipid and A1C today  Eczema:  RX for Triamcinolone 0.1% cream Avoid hot showers Continue moisturizing lotion daily  Ingrown Toenail:  He will follow up with podiatry  RTC in 1 year, sooner if needed Nicki Reaper, NP This visit occurred during the SARS-CoV-2 public health emergency.  Safety protocols were in place, including screening questions prior to the visit, additional usage of staff PPE, and extensive cleaning of exam room while observing appropriate contact time as indicated for disinfecting solutions.

## 2020-05-29 NOTE — Assessment & Plan Note (Signed)
Controlled on Losartan HCT Reinforced DASH diet and exercise for weight loss CMET today 

## 2020-05-29 NOTE — Assessment & Plan Note (Signed)
Continue Omeprazole as needed CBC and CMET today

## 2020-05-30 LAB — CBC
HCT: 44.4 % (ref 38.5–50.0)
Hemoglobin: 15.1 g/dL (ref 13.2–17.1)
MCH: 30.4 pg (ref 27.0–33.0)
MCHC: 34 g/dL (ref 32.0–36.0)
MCV: 89.3 fL (ref 80.0–100.0)
MPV: 9.6 fL (ref 7.5–12.5)
Platelets: 254 10*3/uL (ref 140–400)
RBC: 4.97 10*6/uL (ref 4.20–5.80)
RDW: 12.8 % (ref 11.0–15.0)
WBC: 10.5 10*3/uL (ref 3.8–10.8)

## 2020-05-30 LAB — COMPREHENSIVE METABOLIC PANEL
AG Ratio: 1.4 (calc) (ref 1.0–2.5)
ALT: 26 U/L (ref 9–46)
AST: 21 U/L (ref 10–40)
Albumin: 4 g/dL (ref 3.6–5.1)
Alkaline phosphatase (APISO): 52 U/L (ref 36–130)
BUN: 14 mg/dL (ref 7–25)
CO2: 24 mmol/L (ref 20–32)
Calcium: 9 mg/dL (ref 8.6–10.3)
Chloride: 105 mmol/L (ref 98–110)
Creat: 0.96 mg/dL (ref 0.60–1.35)
Globulin: 2.8 g/dL (calc) (ref 1.9–3.7)
Glucose, Bld: 91 mg/dL (ref 65–99)
Potassium: 4.1 mmol/L (ref 3.5–5.3)
Sodium: 140 mmol/L (ref 135–146)
Total Bilirubin: 0.3 mg/dL (ref 0.2–1.2)
Total Protein: 6.8 g/dL (ref 6.1–8.1)

## 2020-05-30 LAB — LIPID PANEL
Cholesterol: 137 mg/dL (ref <200)
HDL: 42 mg/dL (ref >=40)
LDL Cholesterol (Calc): 78 mg/dL (calc)
Non-HDL Cholesterol (Calc): 95 mg/dL (calc) (ref <130)
Total CHOL/HDL Ratio: 3.3 (calc) (ref <5.0)
Triglycerides: 87 mg/dL (ref <150)

## 2020-05-30 LAB — HEMOGLOBIN A1C
Hgb A1c MFr Bld: 5.9 % of total Hgb — ABNORMAL HIGH (ref <5.7)
Mean Plasma Glucose: 123 (calc)
eAG (mmol/L): 6.8 (calc)

## 2020-05-30 LAB — TSH: TSH: 1.56 mIU/L (ref 0.40–4.50)

## 2020-06-09 ENCOUNTER — Ambulatory Visit: Payer: BC Managed Care – PPO | Admitting: Psychology

## 2020-06-09 DIAGNOSIS — Z20822 Contact with and (suspected) exposure to covid-19: Secondary | ICD-10-CM | POA: Diagnosis not present

## 2020-06-18 ENCOUNTER — Encounter: Payer: Self-pay | Admitting: Internal Medicine

## 2020-06-18 ENCOUNTER — Other Ambulatory Visit: Payer: Self-pay

## 2020-06-18 ENCOUNTER — Ambulatory Visit (INDEPENDENT_AMBULATORY_CARE_PROVIDER_SITE_OTHER): Payer: BC Managed Care – PPO | Admitting: Podiatry

## 2020-06-18 ENCOUNTER — Encounter: Payer: Self-pay | Admitting: Podiatry

## 2020-06-18 DIAGNOSIS — S90112A Contusion of left great toe without damage to nail, initial encounter: Secondary | ICD-10-CM | POA: Diagnosis not present

## 2020-06-18 MED ORDER — LOSARTAN POTASSIUM-HCTZ 50-12.5 MG PO TABS
1.0000 | ORAL_TABLET | Freq: Every day | ORAL | 2 refills | Status: DC
Start: 2020-06-18 — End: 2021-02-22

## 2020-06-19 ENCOUNTER — Encounter: Payer: Self-pay | Admitting: Podiatry

## 2020-06-19 NOTE — Progress Notes (Signed)
Subjective:  Patient ID: Chris Banks, male    DOB: 04-Nov-1985,  MRN: [id number]  Chief Complaint  Patient presents with  . Nail Problem    34 y.o. male presents with the above complaint.  Patient presents with a complaint of history of having a nail contusion followed by the nail growing back however there appears to be old nail that seems to be causing him a lot of pain.  Patient states is painful to touch.  Is hurting across the entire nail itself.  The old nail is pretty well attached to the underlying new nail.  He has not tried anything besides clipping his nail.  He denies any other acute complaints.  He has not seen anyone else prior to seeing me.  He is try some triple antibiotic ointment.   Review of Systems: Negative except as noted in the HPI. Denies N/V/F/Ch.  Past Medical History:  Diagnosis Date  . Allergy   . Asthma    childhood    Current Outpatient Medications:  .  atorvastatin (LIPITOR) 10 MG tablet, Take 1 tablet (10 mg total) by mouth daily., Disp: 90 tablet, Rfl: 3 .  escitalopram (LEXAPRO) 10 MG tablet, Take 1 tablet (10 mg total) by mouth daily., Disp: 90 tablet, Rfl: 0 .  losartan-hydrochlorothiazide (HYZAAR) 50-12.5 MG tablet, Take 1 tablet by mouth daily., Disp: 90 tablet, Rfl: 2 .  omeprazole (PRILOSEC) 20 MG capsule, Take 1 capsule (20 mg total) by mouth daily., Disp: 90 capsule, Rfl: 0 .  triamcinolone cream (KENALOG) 0.1 %, Apply 1 application topically 2 (two) times daily., Disp: 240 g, Rfl: 0  Social History   Tobacco Use  Smoking Status Never Smoker  Smokeless Tobacco Never Used    No Known Allergies Objective:  There were no vitals filed for this visit. There is no height or weight on file to calculate BMI. Constitutional Well developed. Well nourished.  Vascular Dorsalis pedis pulses palpable bilaterally. Posterior tibial pulses palpable bilaterally. Capillary refill normal to all digits.  No cyanosis or clubbing noted. Pedal hair  growth normal.  Neurologic Normal speech. Oriented to person, place, and time. Epicritic sensation to light touch grossly present bilaterally.  Dermatologic  secondary old nail injury noted on top of the new nail plate.  Appears to be well adhered to the underlying new nail plate.  No concern for hematoma noted.  Mild medial border ingrown noted as well.  Orthopedic: Normal joint ROM without pain or crepitus bilaterally. No visible deformities. No bony tenderness.   Radiographs: None Assessment:   1. Contusion of left great toe without damage to nail, initial encounter    Plan:  Patient was evaluated and treated and all questions answered.  Left hallux nail contusion -I explained to patient the etiology of nail contusion given that this happened over a year and a half ago with new nail coming in an old nail still well adhered to the new nail plate.  I believe patient will benefit from debridement of the old nail and therefore reduction in his pain.  Patient would agree with the plan would like to just have the nails debrided down.  Using nail nipper the nail was debrided down to healthy new nail plate.  No complication noted no bleeding noted. -Also discussed with him that he may also have the ingrown that is present that could also be contributing to the pain.  He would like to think about doing an ingrown nail procedure and will return to me when  he is ready. No follow-ups on file.

## 2020-06-25 ENCOUNTER — Ambulatory Visit (INDEPENDENT_AMBULATORY_CARE_PROVIDER_SITE_OTHER): Payer: BC Managed Care – PPO | Admitting: Psychology

## 2020-06-25 ENCOUNTER — Ambulatory Visit: Payer: BC Managed Care – PPO | Attending: Internal Medicine

## 2020-06-25 DIAGNOSIS — Z23 Encounter for immunization: Secondary | ICD-10-CM

## 2020-06-25 DIAGNOSIS — F411 Generalized anxiety disorder: Secondary | ICD-10-CM | POA: Diagnosis not present

## 2020-06-25 NOTE — Progress Notes (Signed)
   Covid-19 Vaccination Clinic  Name:  Chris Banks    MRN: [id number] DOB: 06-05-1986  06/25/2020  Chris Banks was observed post Covid-19 immunization for 15 minutes without incident. He was provided with Vaccine Information Sheet and instruction to access the V-Safe system.   Chris Banks was instructed to call 911 with any severe reactions post vaccine: [name] Kitchen Difficulty breathing  . Swelling of face and throat  . A fast heartbeat  . A bad rash all over body  . Dizziness and weakness   Immunizations Administered    Name Date Dose VIS Date Route   Pfizer COVID-19 Vaccine 06/25/2020  4:30 PM 0.3 mL 05/27/2020 Intramuscular   Manufacturer: [employer], [employer]   Lot: [id number]   NDC: [address]-2

## 2020-07-15 ENCOUNTER — Ambulatory Visit (INDEPENDENT_AMBULATORY_CARE_PROVIDER_SITE_OTHER): Payer: BC Managed Care – PPO | Admitting: Psychology

## 2020-07-15 DIAGNOSIS — F411 Generalized anxiety disorder: Secondary | ICD-10-CM

## 2020-07-22 ENCOUNTER — Ambulatory Visit: Payer: BC Managed Care – PPO | Admitting: Psychology

## 2020-07-26 DIAGNOSIS — Z20822 Contact with and (suspected) exposure to covid-19: Secondary | ICD-10-CM | POA: Diagnosis not present

## 2020-08-10 ENCOUNTER — Telehealth: Payer: Self-pay | Admitting: Podiatry

## 2020-08-10 NOTE — Telephone Encounter (Signed)
Pt request an appt oline for worsening of ingrown.  I called pt and left message for pt to call to schedule an appt.

## 2020-08-12 ENCOUNTER — Encounter: Payer: Self-pay | Admitting: Internal Medicine

## 2020-08-13 ENCOUNTER — Other Ambulatory Visit: Payer: Self-pay

## 2020-08-13 ENCOUNTER — Ambulatory Visit (INDEPENDENT_AMBULATORY_CARE_PROVIDER_SITE_OTHER): Payer: BC Managed Care – PPO | Admitting: Podiatry

## 2020-08-13 ENCOUNTER — Encounter: Payer: Self-pay | Admitting: Podiatry

## 2020-08-13 DIAGNOSIS — L600 Ingrowing nail: Secondary | ICD-10-CM

## 2020-08-13 MED ORDER — ESCITALOPRAM OXALATE 10 MG PO TABS
10.0000 mg | ORAL_TABLET | Freq: Every day | ORAL | 1 refills | Status: DC
Start: 2020-08-13 — End: 2021-02-22

## 2020-08-13 NOTE — Patient Instructions (Signed)

## 2020-08-16 NOTE — Progress Notes (Signed)
  Subjective:  Patient ID: Chris Banks, male    DOB: 02/10/1986,  MRN: [id number]  Chief Complaint  Patient presents with  . Nail Problem    Patient presents today for ingrown toenail    35 y.o. male presents with the above complaint.  Patient presents with complaint of left medial border ingrown.  Patient had lateral border done previously by me.  He states the medial side is very painful.  He would like to have it removed.  He has a history of ingrown and he is followed for complication associated with ingrown's.  I discussed with him in extensive detail he states understanding and would like to have a left medial hallux ingrown removed   Review of Systems: Negative except as noted in the HPI. Denies N/V/F/Ch.  Past Medical History:  Diagnosis Date  . Allergy   . Asthma    childhood    Current Outpatient Medications:  .  atorvastatin (LIPITOR) 10 MG tablet, Take 1 tablet (10 mg total) by mouth daily., Disp: 90 tablet, Rfl: 3 .  escitalopram (LEXAPRO) 10 MG tablet, Take 1 tablet (10 mg total) by mouth daily., Disp: 90 tablet, Rfl: 1 .  losartan-hydrochlorothiazide (HYZAAR) 50-12.5 MG tablet, Take 1 tablet by mouth daily., Disp: 90 tablet, Rfl: 2 .  omeprazole (PRILOSEC) 20 MG capsule, Take 1 capsule (20 mg total) by mouth daily., Disp: 90 capsule, Rfl: 0 .  triamcinolone cream (KENALOG) 0.1 %, Apply 1 application topically 2 (two) times daily., Disp: 240 g, Rfl: 0  Social History   Tobacco Use  Smoking Status Never Smoker  Smokeless Tobacco Never Used    No Known Allergies Objective:  There were no vitals filed for this visit. There is no height or weight on file to calculate BMI. Constitutional Well developed. Well nourished.  Vascular Dorsalis pedis pulses palpable bilaterally. Posterior tibial pulses palpable bilaterally. Capillary refill normal to all digits.  No cyanosis or clubbing noted. Pedal hair growth normal.  Neurologic Normal speech. Oriented to person,  place, and time. Epicritic sensation to light touch grossly present bilaterally.  Dermatologic Painful ingrowing nail at medial nail borders of the hallux nail left. No other open wounds. No skin lesions.  Orthopedic: Normal joint ROM without pain or crepitus bilaterally. No visible deformities. No bony tenderness.   Radiographs: None Assessment:   1. Ingrown left big toenail    Plan:  Patient was evaluated and treated and all questions answered.  Ingrown Nail, left -Patient elects to proceed with minor surgery to remove ingrown toenail removal today. Consent reviewed and signed by patient. -Ingrown nail excised. See procedure note. -Educated on post-procedure care including soaking. Written instructions provided and reviewed. -Patient to follow up in 2 weeks for nail check.  Procedure: Excision of Ingrown Toenail Location: Left 1st toe medial nail borders. Anesthesia: Lidocaine 1% plain; 1.5 mL and Marcaine 0.5% plain; 1.5 mL, digital block. Skin Prep: Betadine. Dressing: Silvadene; telfa; dry, sterile, compression dressing. Technique: Following skin prep, the toe was exsanguinated and a tourniquet was secured at the base of the toe. The affected nail border was freed, split with a nail splitter, and excised. Chemical matrixectomy was then performed with phenol and irrigated out with alcohol. The tourniquet was then removed and sterile dressing applied. Disposition: Patient tolerated procedure well. Patient to return in 2 weeks for follow-up.   No follow-ups on file.

## 2020-08-24 ENCOUNTER — Telehealth: Payer: BC Managed Care – PPO | Admitting: Physician Assistant

## 2020-08-24 ENCOUNTER — Encounter: Payer: Self-pay | Admitting: Internal Medicine

## 2020-08-24 DIAGNOSIS — U071 COVID-19: Secondary | ICD-10-CM

## 2020-08-24 DIAGNOSIS — R509 Fever, unspecified: Secondary | ICD-10-CM

## 2020-08-24 DIAGNOSIS — R059 Cough, unspecified: Secondary | ICD-10-CM

## 2020-08-24 MED ORDER — AZELASTINE HCL 0.1 % NA SOLN: 1.0000 | Freq: Two times a day (BID) | NASAL | 0 refills | Status: DC

## 2020-08-24 MED ORDER — BENZONATATE 100 MG PO CAPS
100.0000 mg | ORAL_CAPSULE | Freq: Three times a day (TID) | ORAL | 0 refills | Status: DC | PRN
Start: 2020-08-24 — End: 2020-12-31

## 2020-08-24 NOTE — Progress Notes (Signed)
E-Visit for Corona Virus Screening  We are sorry you are not feeling well. We are here to help!  You have tested positive for COVID-19, meaning that you were infected with the novel coronavirus and could give the virus to others.  It is vitally important that you stay home so you do not spread it to others.      Please continue isolation at home, for at least 10 days since the start of your symptoms and until you have had 24 hours with no fever (without taking a fever reducer) and with improving of symptoms.  If you have no symptoms but tested positive (or all symptoms resolve after 5 days and you have no fever) you can leave your house but continue to wear a mask around others for an additional 5 days. If you have a fever,continue to stay home until you have had 24 hours of no fever. Most cases improve 5-10 days from onset but we have seen a small number of patients who have gotten worse after the 10 days.  Please be sure to watch for worsening symptoms and remain taking the proper precautions.   Go to the nearest hospital ED for assessment if fever/cough/breathlessness are severe or illness seems like a threat to life.    The following symptoms may appear 2-14 days after exposure: . Fever . Cough . Shortness of breath or difficulty breathing . Chills . Repeated shaking with chills . Muscle pain . Headache . Sore throat . New loss of taste or smell . Fatigue . Congestion or runny nose . Nausea or vomiting . Diarrhea  You have been enrolled in [facility] Monitoring for COVID-19. Daily you will receive a questionnaire within the MyChart website. Our COVID-19 response team will be monitoring your responses daily.  You can use medication such as A prescription cough medication called Tessalon Perles 100 mg. You may take 1-2 capsules every 8 hours as needed for cough, and A prescription for Azelastine nasal spray 2 sprays in each nostril twice per day  You have tested positive for Covid  but because you are not considered high risk you do not qualify for monoclonal antibody infusion.  Supportive care is all that is needed.   You may also take acetaminophen (Tylenol) as needed for fever.  HOME CARE: . Only take medications as instructed by your medical team. . Drink plenty of fluids and get plenty of rest. . A steam or ultrasonic humidifier can help if you have congestion.   GET HELP RIGHT AWAY IF YOU HAVE EMERGENCY WARNING SIGNS.  Call 911 or proceed to your closest emergency facility if: . You develop worsening high fever. . Trouble breathing . Bluish lips or face . Persistent pain or pressure in the chest . New confusion . Inability to wake or stay awake . You cough up blood. . Your symptoms become more severe . Inability to hold down food or fluids  This list is not all possible symptoms. Contact your medical provider for any symptoms that are severe or concerning to you.    Your e-visit answers were reviewed by a board certified advanced clinical practitioner to complete your personal care plan.  Depending on the condition, your plan could have included both over the counter or prescription medications.  If there is a problem please reply once you have received a response from your provider.  Your safety is important to [place].  If you have drug allergies check your prescription carefully.    You  can use MyChart to ask questions about today's visit, request a non-urgent call back, or ask for a work or school excuse for 24 hours related to this e-Visit. If it has been greater than 24 hours you will need to follow up with your provider, or enter a new e-Visit to address those concerns. You will get an e-mail in the next two days asking about your experience.  I hope that your e-visit has been valuable and will speed your recovery. Thank you for using e-visits.      Greater than 5 minutes, yet less than 10 minutes of time have been spent researching, coordinating  and implementing care for this patient today.

## 2020-08-25 NOTE — Telephone Encounter (Signed)
Needing VV appt

## 2020-08-27 ENCOUNTER — Encounter: Payer: Self-pay | Admitting: Internal Medicine

## 2020-08-27 ENCOUNTER — Telehealth (INDEPENDENT_AMBULATORY_CARE_PROVIDER_SITE_OTHER): Payer: BC Managed Care – PPO | Admitting: Internal Medicine

## 2020-08-27 DIAGNOSIS — R0981 Nasal congestion: Secondary | ICD-10-CM

## 2020-08-27 DIAGNOSIS — R52 Pain, unspecified: Secondary | ICD-10-CM

## 2020-08-27 DIAGNOSIS — U071 COVID-19: Secondary | ICD-10-CM

## 2020-08-27 DIAGNOSIS — R509 Fever, unspecified: Secondary | ICD-10-CM

## 2020-08-27 DIAGNOSIS — J029 Acute pharyngitis, unspecified: Secondary | ICD-10-CM

## 2020-08-27 DIAGNOSIS — R059 Cough, unspecified: Secondary | ICD-10-CM

## 2020-08-27 NOTE — Progress Notes (Signed)
Virtual Visit via Video Note  I connected with Chris Banks on 08/27/20 at  9:00 AM EST by a video enabled telemedicine application and verified that I am speaking with the correct person using two identifiers.  Location: Patient: Home Provider: Office  Persons participating in this video call: Nicki Reaper, NP and Annetta Maw   I discussed the limitations of evaluation and management by telemedicine and the availability of in person appointments. The patient expressed understanding and agreed to proceed.  History of Present Illness:  Patient reports nasal congestion, sore throat, cough and chest congestion.  He reports this started 1 week ago.  He is not blowing any mucus out of his nose.  He denies difficulty swallowing at this time.  The cough is mostly dry nonproductive, worse at night.  He did have a fever but reports he has not had 1 in 2 days but does have chills and body aches.  He denies headache, runny nose, ear pain, loss of taste/smell, chest pain or shortness of breath.  He has taken Mucinex OTC and Tessalon with some relief of symptoms.  He did have exposure through work, he tested positive on Monday.  He reports his symptoms are overall improving.  Past Medical History:  Diagnosis Date  . Allergy   . Asthma    childhood    Current Outpatient Medications  Medication Sig Dispense Refill  . atorvastatin (LIPITOR) 10 MG tablet Take 1 tablet (10 mg total) by mouth daily. 90 tablet 3  . azelastine (ASTELIN) 0.1 % nasal spray Place 1 spray into both nostrils 2 (two) times daily. Use in each nostril as directed 30 mL 0  . benzonatate (TESSALON) 100 MG capsule Take 1-2 capsules (100-200 mg total) by mouth 3 (three) times daily as needed for cough. 40 capsule 0  . escitalopram (LEXAPRO) 10 MG tablet Take 1 tablet (10 mg total) by mouth daily. 90 tablet 1  . losartan-hydrochlorothiazide (HYZAAR) 50-12.5 MG tablet Take 1 tablet by mouth daily. 90 tablet 2  . omeprazole (PRILOSEC) 20  MG capsule Take 1 capsule (20 mg total) by mouth daily. 90 capsule 0  . triamcinolone cream (KENALOG) 0.1 % Apply 1 application topically 2 (two) times daily. 240 g 0   No current facility-administered medications for this visit.    No Known Allergies  Family History  Problem Relation Age of Onset  . Stroke Maternal Grandfather   . Hypertension Paternal Grandfather   . Cancer Neg Hx     Social History   Socioeconomic History  . Marital status: Married    Spouse name: Not on file  . Number of children: Not on file  . Years of education: Not on file  . Highest education level: Not on file  Occupational History  . Not on file  Tobacco Use  . Smoking status: Never Smoker  . Smokeless tobacco: Never Used  Substance and Sexual Activity  . Alcohol use: Yes    Alcohol/week: 0.0 standard drinks    Comment: occasional  . Drug use: No  . Sexual activity: Yes  Other Topics Concern  . Not on file  Social History Narrative  . Not on file   Social Determinants of Health   Financial Resource Strain: Not on file  Food Insecurity: Not on file  Transportation Needs: Not on file  Physical Activity: Not on file  Stress: Not on file  Social Connections: Not on file  Intimate Partner Violence: Not on file     Constitutional:  Pt reports fever, chills and body aches. Denies malaise, fatigue, headache or abrupt weight changes.  HEENT: Pt reports nasal congestion, sore throat. Denies eye pain, eye redness, ear pain, ringing in the ears, wax buildup, runny nose, bloody nose. Respiratory: Pt reports cough. Denies difficulty breathing, shortness of breath, or sputum production.   Cardiovascular: Denies chest pain, chest tightness, palpitations or swelling in the hands or feet.  Gastrointestinal: Denies abdominal pain, bloating, constipation, diarrhea or blood in the stool.   No other specific complaints in a complete review of systems (except as listed in HPI  above).  Observations/Objective:  Wt Readings from Last 3 Encounters:  05/29/20 (!) 365 lb (165.6 kg)  06/21/19 (!) 366 lb (166 kg)  03/12/19 (!) 347 lb (157.4 kg)    General: Appears his stated age, obese, in NAD. HEENT: Nose: congestion noted ; Throat/Mouth: hoarseness noted Pulmonary/Chest: Normal effort. No respiratory distress.  Neurological: Alert and oriented.   BMET    Component Value Date/Time   NA 140 05/29/2020 1458   K 4.1 05/29/2020 1458   CL 105 05/29/2020 1458   CO2 24 05/29/2020 1458   GLUCOSE 91 05/29/2020 1458   BUN 14 05/29/2020 1458   CREATININE 0.96 05/29/2020 1458   CALCIUM 9.0 05/29/2020 1458    Lipid Panel     Component Value Date/Time   CHOL 137 05/29/2020 1458   TRIG 87 05/29/2020 1458   HDL 42 05/29/2020 1458   CHOLHDL 3.3 05/29/2020 1458   VLDL 25.2 03/12/2019 1543   LDLCALC 78 05/29/2020 1458    CBC    Component Value Date/Time   WBC 10.5 05/29/2020 1458   RBC 4.97 05/29/2020 1458   HGB 15.1 05/29/2020 1458   HCT 44.4 05/29/2020 1458   PLT 254 05/29/2020 1458   MCV 89.3 05/29/2020 1458   MCH 30.4 05/29/2020 1458   MCHC 34.0 05/29/2020 1458   RDW 12.8 05/29/2020 1458    Hgb A1C Lab Results  Component Value Date   HGBA1C 5.9 (H) 05/29/2020        Assessment and Plan:  Nasal Congestion, Sore Throat, Cough, Fever, Chills, Body Aches secondary to Covid 19:  Symptoms have slightly improved per patient Rx for Promethazine DM for cough-sedation caution given No indication for antibiotics or steroids at this time Given his improving symptoms, no indication for referral for antiviral therapy or monoclonal antibody infusion Encouraged rest and fluids  Return/ER precautions discussed  Follow Up Instructions:    I discussed the assessment and treatment plan with the patient. The patient was provided an opportunity to ask questions and all were answered. The patient agreed with the plan and demonstrated an understanding of  the instructions.   The patient was advised to call back or seek an in-person evaluation if the symptoms worsen or if the condition fails to improve as anticipated.   Nicki Reaper, NP

## 2020-08-27 NOTE — Patient Instructions (Signed)

## 2020-08-27 NOTE — Telephone Encounter (Signed)
Pt has been scheduled for virtual visit  

## 2020-09-01 ENCOUNTER — Ambulatory Visit: Payer: BC Managed Care – PPO | Admitting: Podiatry

## 2020-09-02 ENCOUNTER — Ambulatory Visit (INDEPENDENT_AMBULATORY_CARE_PROVIDER_SITE_OTHER): Payer: BC Managed Care – PPO | Admitting: Psychology

## 2020-09-02 DIAGNOSIS — F411 Generalized anxiety disorder: Secondary | ICD-10-CM | POA: Diagnosis not present

## 2020-09-30 ENCOUNTER — Ambulatory Visit (INDEPENDENT_AMBULATORY_CARE_PROVIDER_SITE_OTHER): Payer: BC Managed Care – PPO | Admitting: Psychology

## 2020-09-30 DIAGNOSIS — F411 Generalized anxiety disorder: Secondary | ICD-10-CM | POA: Diagnosis not present

## 2020-10-28 ENCOUNTER — Ambulatory Visit (INDEPENDENT_AMBULATORY_CARE_PROVIDER_SITE_OTHER): Payer: BC Managed Care – PPO | Admitting: Psychology

## 2020-10-28 DIAGNOSIS — F410 Panic disorder [episodic paroxysmal anxiety] without agoraphobia: Secondary | ICD-10-CM

## 2020-11-13 ENCOUNTER — Ambulatory Visit: Payer: BC Managed Care – PPO | Admitting: Psychology

## 2020-11-25 ENCOUNTER — Ambulatory Visit (INDEPENDENT_AMBULATORY_CARE_PROVIDER_SITE_OTHER): Payer: BC Managed Care – PPO | Admitting: Psychology

## 2020-11-25 DIAGNOSIS — F411 Generalized anxiety disorder: Secondary | ICD-10-CM

## 2020-12-14 ENCOUNTER — Ambulatory Visit (INDEPENDENT_AMBULATORY_CARE_PROVIDER_SITE_OTHER): Payer: BC Managed Care – PPO | Admitting: Psychology

## 2020-12-14 DIAGNOSIS — F411 Generalized anxiety disorder: Secondary | ICD-10-CM

## 2020-12-29 ENCOUNTER — Encounter: Payer: Self-pay | Admitting: Internal Medicine

## 2020-12-30 NOTE — Telephone Encounter (Signed)
Can you call him and have him schedule virtual office visit?

## 2020-12-31 ENCOUNTER — Other Ambulatory Visit: Payer: Self-pay

## 2020-12-31 ENCOUNTER — Ambulatory Visit: Payer: BC Managed Care – PPO | Admitting: Psychology

## 2020-12-31 ENCOUNTER — Telehealth: Payer: Self-pay | Admitting: Internal Medicine

## 2020-12-31 ENCOUNTER — Telehealth (INDEPENDENT_AMBULATORY_CARE_PROVIDER_SITE_OTHER): Payer: BC Managed Care – PPO | Admitting: Internal Medicine

## 2020-12-31 ENCOUNTER — Encounter: Payer: Self-pay | Admitting: Internal Medicine

## 2020-12-31 DIAGNOSIS — J069 Acute upper respiratory infection, unspecified: Secondary | ICD-10-CM | POA: Diagnosis not present

## 2020-12-31 MED ORDER — HYDROCOD POLST-CPM POLST ER 10-8 MG/5ML PO SUER: 5.0000 mL | Freq: Two times a day (BID) | ORAL | 0 refills | Status: AC | PRN

## 2020-12-31 MED ORDER — HYDROCOD POLST-CPM POLST ER 10-8 MG/5ML PO SUER: 5.0000 mL | Freq: Two times a day (BID) | ORAL | 0 refills | Status: DC | PRN

## 2020-12-31 MED ORDER — PREDNISONE 10 MG PO TABS: ORAL_TABLET | ORAL | 0 refills | Status: AC

## 2020-12-31 NOTE — Progress Notes (Signed)
Virtual Visit via Video Note  I connected with Chris Banks on 12/31/20 at  8:40 AM EDT by a video enabled telemedicine application and verified that I am speaking with the correct person using two identifiers.  Location: Patient: Home Provider: Office  Persons participating in this video call: Nicki Reaper, NP and Annetta Maw.   I discussed the limitations of evaluation and management by telemedicine and the availability of in person appointments. The patient expressed understanding and agreed to proceed.  History of Present Illness:  Patient reports fever, headache, sore throat and cough.  He reports this started 4 days ago.  The headache is located all over his head.  He describes the pain as dull and achy.  He denies visual changes or dizziness.  He denies difficulty swallowing.  The cough is productive at times of clear mucus.  He reports some chest congestion.  He denies runny nose, nasal congestion, ear pain or shortness of breath.  He has run a fever as high as 102 but is not currently running a fever.  He reports his chills and body aches have improved.  He has not had sick contacts that he is aware of.  He has had 2 negative home COVID test.   Past Medical History:  Diagnosis Date  . Allergy   . Asthma    childhood    Current Outpatient Medications  Medication Sig Dispense Refill  . atorvastatin (LIPITOR) 10 MG tablet Take 1 tablet (10 mg total) by mouth daily. 90 tablet 3  . azelastine (ASTELIN) 0.1 % nasal spray Place 1 spray into both nostrils 2 (two) times daily. Use in each nostril as directed 30 mL 0  . benzonatate (TESSALON) 100 MG capsule Take 1-2 capsules (100-200 mg total) by mouth 3 (three) times daily as needed for cough. 40 capsule 0  . escitalopram (LEXAPRO) 10 MG tablet Take 1 tablet (10 mg total) by mouth daily. 90 tablet 1  . losartan-hydrochlorothiazide (HYZAAR) 50-12.5 MG tablet Take 1 tablet by mouth daily. 90 tablet 2  . omeprazole (PRILOSEC) 20 MG capsule  Take 1 capsule (20 mg total) by mouth daily. 90 capsule 0  . triamcinolone cream (KENALOG) 0.1 % Apply 1 application topically 2 (two) times daily. 240 g 0   No current facility-administered medications for this visit.    No Known Allergies  Family History  Problem Relation Age of Onset  . Stroke Maternal Grandfather   . Hypertension Paternal Grandfather   . Cancer Neg Hx     Social History   Socioeconomic History  . Marital status: Married    Spouse name: Not on file  . Number of children: Not on file  . Years of education: Not on file  . Highest education level: Not on file  Occupational History  . Not on file  Tobacco Use  . Smoking status: Never Smoker  . Smokeless tobacco: Never Used  Substance and Sexual Activity  . Alcohol use: Yes    Alcohol/week: 0.0 standard drinks    Comment: occasional  . Drug use: No  . Sexual activity: Yes  Other Topics Concern  . Not on file  Social History Narrative  . Not on file   Social Determinants of Health   Financial Resource Strain: Not on file  Food Insecurity: Not on file  Transportation Needs: Not on file  Physical Activity: Not on file  Stress: Not on file  Social Connections: Not on file  Intimate Partner Violence: Not on file  Constitutional: Patient reports fever and headache.  Denies malaise, fatigue, or abrupt weight changes.  HEENT: Patient reports sore throat.  Denies eye pain, eye redness, ear pain, ringing in the ears, wax buildup, runny nose, nasal congestion, bloody nose. Respiratory: Patient reports cough.  Denies difficulty breathing, shortness of breath, or sputum production.   Cardiovascular: Denies chest pain, chest tightness, palpitations or swelling in the hands or feet.   No other specific complaints in a complete review of systems (except as listed in HPI above).    Observations/Objective:    Wt Readings from Last 3 Encounters:  05/29/20 (!) 365 lb (165.6 kg)  06/21/19 (!) 366 lb (166  kg)  03/12/19 (!) 347 lb (157.4 kg)    General: Appears his stated age, in NAD. HEENT: Head: normal shape and size; Nose: No congestion noted; Throat/Mouth: Slight hoarseness noted  Pulmonary/Chest: Normal effort. No respiratory distress.  Neurological: Alert and oriented.   BMET    Component Value Date/Time   NA 140 05/29/2020 1458   K 4.1 05/29/2020 1458   CL 105 05/29/2020 1458   CO2 24 05/29/2020 1458   GLUCOSE 91 05/29/2020 1458   BUN 14 05/29/2020 1458   CREATININE 0.96 05/29/2020 1458   CALCIUM 9.0 05/29/2020 1458    Lipid Panel     Component Value Date/Time   CHOL 137 05/29/2020 1458   TRIG 87 05/29/2020 1458   HDL 42 05/29/2020 1458   CHOLHDL 3.3 05/29/2020 1458   VLDL 25.2 03/12/2019 1543   LDLCALC 78 05/29/2020 1458    CBC    Component Value Date/Time   WBC 10.5 05/29/2020 1458   RBC 4.97 05/29/2020 1458   HGB 15.1 05/29/2020 1458   HCT 44.4 05/29/2020 1458   PLT 254 05/29/2020 1458   MCV 89.3 05/29/2020 1458   MCH 30.4 05/29/2020 1458   MCHC 34.0 05/29/2020 1458   RDW 12.8 05/29/2020 1458    Hgb A1C Lab Results  Component Value Date   HGBA1C 5.9 (H) 05/29/2020       Assessment and Plan:  Viral URI with Cough:  Has had 2 negative home COVID test Discussed possibility of influenza but he has not had known exposure Encourage rest and fluids Rx for Pred taper x6 days for symptom management Rx for Tussionex for cough  Return precautions discussed  Follow Up Instructions:    I discussed the assessment and treatment plan with the patient. The patient was provided an opportunity to ask questions and all were answered. The patient agreed with the plan and demonstrated an understanding of the instructions.   The patient was advised to call back or seek an in-person evaluation if the symptoms worsen or if the condition fails to improve as anticipated.     Nicki Reaper, NP

## 2020-12-31 NOTE — Addendum Note (Signed)
Addended by: Lorre Munroe on: 12/31/2020 10:20 AM   Modules accepted: Orders

## 2020-12-31 NOTE — Telephone Encounter (Addendum)
Pt is calling and [facility] [address] in [place] do not have enough of generic tussionex medication and the one in stock is not cover by the patient insurance per [employer] pharmacist. [facility] pharmacist would like to know if they should deactivate the medication and per [employer] normally cover 7 day supply. Pt would like the medication to be sent to new pharm [facility] [address] in [place] phone number [phone]. Pt seen regina today

## 2020-12-31 NOTE — Telephone Encounter (Signed)
Tussionex sent to [facility]

## 2020-12-31 NOTE — Patient Instructions (Signed)

## 2021-02-16 ENCOUNTER — Encounter: Payer: Self-pay | Admitting: Internal Medicine

## 2021-02-16 DIAGNOSIS — Z0001 Encounter for general adult medical examination with abnormal findings: Secondary | ICD-10-CM

## 2021-02-22 ENCOUNTER — Telehealth: Payer: Self-pay

## 2021-02-22 ENCOUNTER — Other Ambulatory Visit: Payer: Self-pay | Admitting: Internal Medicine

## 2021-02-22 MED ORDER — ESCITALOPRAM OXALATE 10 MG PO TABS: 10.0000 mg | ORAL_TABLET | Freq: Every day | ORAL | 1 refills | Status: AC

## 2021-02-22 NOTE — Telephone Encounter (Signed)
Copied from CRM [id number]. Topic: General - Other >> Feb 22, 2021 12:32 PM Marylen Ponto wrote: Reason for CRM: Pt called for an update on his refill request submitted through [facility]. Pt advised to contact pharmacy to request refill

## 2021-02-22 NOTE — Addendum Note (Signed)
Addended by: Lonna Cobb on: 02/22/2021 10:44 AM   Modules accepted: Orders

## 2021-02-22 NOTE — Telephone Encounter (Signed)
   Notes to clinic:  review for refill  Looks like you will continue to fill medications for patient    Requested Prescriptions  Pending Prescriptions Disp Refills   escitalopram (LEXAPRO) 10 MG tablet [Pharmacy Med Name: Escitalopram Oxalate 10 MG Oral Tablet] 90 tablet 0    Sig: Take 1 tablet by mouth once daily      There is no refill protocol information for this order      losartan-hydrochlorothiazide (HYZAAR) 50-12.5 MG tablet [Pharmacy Med Name: Losartan Potassium-HCTZ 50-12.5 MG Oral Tablet] 90 tablet 0    Sig: Take 1 tablet by mouth once daily      There is no refill protocol information for this order

## 2021-03-15 ENCOUNTER — Encounter: Payer: Self-pay | Admitting: Internal Medicine

## 2021-03-16 MED ORDER — LOSARTAN POTASSIUM-HCTZ 50-12.5 MG PO TABS: 1.0000 | ORAL_TABLET | Freq: Every day | ORAL | 0 refills | Status: DC

## 2021-05-25 ENCOUNTER — Encounter: Payer: Self-pay | Admitting: Internal Medicine

## 2021-05-25 DIAGNOSIS — Z0001 Encounter for general adult medical examination with abnormal findings: Secondary | ICD-10-CM

## 2021-05-25 MED ORDER — ATORVASTATIN CALCIUM 10 MG PO TABS: 10.0000 mg | ORAL_TABLET | Freq: Every day | ORAL | 0 refills | Status: AC

## 2021-06-04 ENCOUNTER — Encounter: Payer: BC Managed Care – PPO | Admitting: Internal Medicine

## 2021-06-28 ENCOUNTER — Other Ambulatory Visit: Payer: Self-pay | Admitting: Internal Medicine

## 2021-06-28 NOTE — Telephone Encounter (Signed)
Requested medication (s) are due for refill today:   Yes  Requested medication (s) are on the active medication list:   Yes  Future visit scheduled:   No   Last ordered: 03/16/2021 #90, 0 refills  Failed protocol due to labs being due   Requested Prescriptions  Pending Prescriptions Disp Refills   losartan-hydrochlorothiazide (HYZAAR) 50-12.5 MG tablet [Pharmacy Med Name: LOSARTAN/HCTZ 50/12.5MG  TABLETS] 90 tablet 0    Sig: Take 1 tablet by mouth daily.     Cardiovascular: ARB + Diuretic Combos Failed - 06/28/2021  9:44 AM      Failed - K in normal range and within 180 days    Potassium  Date Value Ref Range Status  05/29/2020 4.1 3.5 - 5.3 mmol/L Final          Failed - Na in normal range and within 180 days    Sodium  Date Value Ref Range Status  05/29/2020 140 135 - 146 mmol/L Final          Failed - Cr in normal range and within 180 days    Creat  Date Value Ref Range Status  05/29/2020 0.96 0.60 - 1.35 mg/dL Final          Failed - Ca in normal range and within 180 days    Calcium  Date Value Ref Range Status  05/29/2020 9.0 8.6 - 10.3 mg/dL Final          Passed - Patient is not pregnant      Passed - Last BP in normal range    BP Readings from Last 1 Encounters:  05/29/20 138/86          Passed - Valid encounter within last 6 months    Recent Outpatient Visits           5 months ago Viral URI with cough   [facility] [place], [place], NP

## 2021-08-12 ENCOUNTER — Other Ambulatory Visit: Payer: Self-pay | Admitting: Internal Medicine

## 2021-08-12 DIAGNOSIS — Z0001 Encounter for general adult medical examination with abnormal findings: Secondary | ICD-10-CM

## 2021-08-13 NOTE — Telephone Encounter (Signed)
Requested medication (s) are due for refill today: yes  Requested medication (s) are on the active medication list: yes  Last refill:  05/25/21  Future visit scheduled: no, was seen 05/2021, visit scheduled for 06/04/21 canceled by provider, labs are from 05/2020  Notes to clinic:  Failed protocol of labs within 360 days, (05/2020) no upcoming appt, please assess.   Requested Prescriptions  Pending Prescriptions Disp Refills   atorvastatin (LIPITOR) 10 MG tablet [Pharmacy Med Name: ATORVASTATIN 10MG  TABLETS] 90 tablet 0    Sig: TAKE 1 TABLET(10 MG) BY MOUTH DAILY     Cardiovascular:  Antilipid - Statins Failed - 08/12/2021 12:43 PM      Failed - Total Cholesterol in normal range and within 360 days    Cholesterol  Date Value Ref Range Status  05/29/2020 137 <200 mg/dL Final          Failed - LDL in normal range and within 360 days    LDL Cholesterol (Calc)  Date Value Ref Range Status  05/29/2020 78 mg/dL (calc) Final    Comment:    Reference range: <100 . Desirable range <100 mg/dL for primary prevention;   <70 mg/dL for patients with CHD or diabetic patients  with > or = 2 CHD risk factors. 05/31/2020 LDL-C is now calculated using the Martin-Hopkins  calculation, which is a validated novel method providing  better accuracy than the Friedewald equation in the  estimation of LDL-C.  [name] Kitchen et al. Horald Pollen. Lenox Ahr): 2061-2068  (http://education.[facility].com/faq/FAQ164)    Direct LDL  Date Value Ref Range Status  10/24/2017 151.0 mg/dL Final    Comment:    Optimal:  <100 mg/dLNear or Above Optimal:  100-129 mg/dLBorderline High:  130-159 mg/dLHigh:  160-189 mg/dLVery High:  >190 mg/dL          Failed - HDL in normal range and within 360 days    HDL  Date Value Ref Range Status  05/29/2020 42 > OR = 40 mg/dL Final          Failed - Triglycerides in normal range and within 360 days    Triglycerides  Date Value Ref Range Status  05/29/2020 87 <150 mg/dL Final           Passed - Patient is not pregnant      Passed - Valid encounter within last 12 months    Recent Outpatient Visits           7 months ago Viral URI with cough   [facility] [place], [place], NP

## 2021-08-17 ENCOUNTER — Other Ambulatory Visit: Payer: Self-pay | Admitting: Internal Medicine

## 2021-08-17 DIAGNOSIS — Z0001 Encounter for general adult medical examination with abnormal findings: Secondary | ICD-10-CM

## 2021-09-09 ENCOUNTER — Other Ambulatory Visit: Payer: Self-pay | Admitting: Internal Medicine

## 2021-09-09 NOTE — Telephone Encounter (Signed)
Refused because [place] of [place].  Requested Prescriptions  Pending Prescriptions Disp Refills   losartan-hydrochlorothiazide (HYZAAR) 50-12.5 MG tablet [Pharmacy Med Name: LOSARTAN/HCTZ 50/12.5MG TABLETS] 90 tablet 0    Sig: TAKE 1 TABLET BY MOUTH DAILY     Cardiovascular: ARB + Diuretic Combos Failed - 09/09/2021  6:15 AM      Failed - K in normal range and within 180 days    Potassium  Date Value Ref Range Status  05/29/2020 4.1 3.5 - 5.3 mmol/L Final         Failed - Na in normal range and within 180 days    Sodium  Date Value Ref Range Status  05/29/2020 140 135 - 146 mmol/L Final         Failed - Cr in normal range and within 180 days    Creat  Date Value Ref Range Status  05/29/2020 0.96 0.60 - 1.35 mg/dL Final         Failed - eGFR is 10 or above and within 180 days    GFR  Date Value Ref Range Status  03/12/2019 111.13 >60.00 mL/min Final         Failed - Valid encounter within last 6 months    Recent Outpatient Visits          8 months ago Viral URI with cough   [place], [place]             Passed - Patient is not pregnant      Passed - Last BP in normal range    BP Readings from Last 1 Encounters:  05/29/20 138/86
